# Patient Record
Sex: Female | Born: 1935 | Race: White | Hispanic: No | Smoking: Never smoker
Health system: Southern US, Community
[De-identification: ages and names within clinical notes are randomized; demographics above are authoritative.]

## PROBLEM LIST (undated history)

## (undated) DIAGNOSIS — E785 Hyperlipidemia, unspecified: Secondary | ICD-10-CM

## (undated) DIAGNOSIS — E559 Vitamin D deficiency, unspecified: Secondary | ICD-10-CM

## (undated) DIAGNOSIS — E079 Disorder of thyroid, unspecified: Secondary | ICD-10-CM

## (undated) DIAGNOSIS — I10 Essential (primary) hypertension: Secondary | ICD-10-CM

## (undated) HISTORY — PX: ABDOMINAL HYSTERECTOMY: SHX81

---

## 2004-04-23 ENCOUNTER — Emergency Department (HOSPITAL_COMMUNITY): Admission: EM | Admit: 2004-04-23 | Discharge: 2004-04-23 | Payer: Self-pay | Admitting: Emergency Medicine

## 2005-09-07 ENCOUNTER — Ambulatory Visit (HOSPITAL_BASED_OUTPATIENT_CLINIC_OR_DEPARTMENT_OTHER): Admission: RE | Admit: 2005-09-07 | Discharge: 2005-09-08 | Payer: Self-pay | Admitting: Orthopedic Surgery

## 2008-03-29 ENCOUNTER — Ambulatory Visit: Payer: Self-pay | Admitting: Vascular Surgery

## 2010-05-30 NOTE — Procedures (Signed)
DUPLEX ULTRASOUND OF ABDOMINAL AORTA   INDICATION:  Palpable abdominal mass.   HISTORY:  Diabetes:  No.  Cardiac:  No.  Hypertension:  Yes.  Smoking:  No.  Connective Tissue Disorder:  Family History:  No.  Previous Surgery:  No.   DUPLEX EXAM:         AP (cm)                   TRANSVERSE (cm)  Proximal             2.13 cm                   2.21 cm  Mid                  1.83 cm                   1.76 cm  Distal               1.59 cm                   1.80 cm  Right Iliac          1.25 cm                   1.11 cm  Left Iliac           1.24 cm                   1.20 cm   PREVIOUS:  Date:  AP:  TRANSVERSE:   IMPRESSION:  Duplex shows no evidence of abdominal aortic aneurysm.   ___________________________________________  Janetta Hora Fields, MD   AC/MEDQ  D:  03/29/2008  T:  03/29/2008  Job:  [id number]

## 2010-05-30 NOTE — Procedures (Signed)
CAROTID DUPLEX EXAM   INDICATION:  Carotid bruit.   HISTORY:  Diabetes:  No.  Cardiac:  No.  Hypertension:  Yes.  Smoking:  No.  Previous Surgery:  No.  CV History:  No.  Amaurosis Fugax No, Paresthesias No, Hemiparesis No.                                       RIGHT             LEFT  Brachial systolic pressure:         176               180  Brachial Doppler waveforms:         Biphasic          Biphasic  Vertebral direction of flow:        Antegrade         Antegrade  DUPLEX VELOCITIES (cm/sec)  CCA peak systolic                   72                68  ECA peak systolic                   73                80  ICA peak systolic                   107               70  ICA end diastolic                   25                17  PLAQUE MORPHOLOGY:                  Heterogenous      Heterogenous  PLAQUE AMOUNT:                      Mild              Mild  PLAQUE LOCATION:                    BIF/ICA           BIF/ICA   IMPRESSION:  20-39% bilateral internal carotid artery stenosis.    ___________________________________________  Janetta Hora Fields, MD   AC/MEDQ  D:  03/29/2008  T:  03/29/2008  Job:  [id number]

## 2010-06-02 NOTE — Op Note (Signed)
[name], [name] NO.:  [id number]   MEDICAL RECORD NO.:  [id number]          PATIENT TYPE:  AMB   LOCATION:  DSC                          FACILITY:  [facility]   PHYSICIAN:  Harvie Junior, M.D.   DATE OF BIRTH:  May 13, 1917   DATE OF PROCEDURE:  DATE OF DISCHARGE:                                 OPERATIVE REPORT   SUBJECTIVE:  She is an 77 year old female for orthopedic surgery.  Date of  surgery is going to be 09/07/2005.  Date of dictation is 09/07/2005.   PREOPERATIVE DIAGNOSIS:  Comminuted olecranon fracture left.   POSTOPERATIVE DIAGNOSIS:  Comminuted olecranon fracture left.   PROCEDURE:  Open reduction internal fixation of left comminuted olecranon  fracture.   SURGEON:  Jodi Geralds, M.D.   ANESTHESIA:  General.   BRIEF HISTORY:  Patient is an 77 year old female who was on her way to work  and fell off her bottom stair and suffered an olecranon fracture.  She  presented to an urgent care center and was noted to have this fracture and  sent over for evaluation.  X-rays showed a displaced olecranon fracture and  given her age and osteoporotic status, we felt that this would need to be  fixed.  This is a very active 77 year old who is still working.  She has  been working for the same company for 52 years.  We felt that fixation was  going to be appropriate.  We talked about treatment options and she  ultimately was brought to the operating room for open reduction internal  fixation of her olecranon fracture.   PROCEDURE:  Patient is brought to the operating room, where after adequate  anesthesia was obtained with a general anesthetic, the patient was placed on  the operating table.  The left arm was prepped and draped in the usual  sterile fashion.  Following this, an incision was made over the olecranon  and subcutaneous tissue dissected down to the area of the fracture  fragments.  There is one large fracture fragment of the medial olecranon.  There was a comminuted articular piece laterally and in the over area of the  lateral side.  We took the articular piece and basically anatomically  reduced it and then locked it in with the lateral posterior portion and  basically got an anatomic reduction.  Two long K wires were advanced down  the shaft of the humeral canal and these under fluoro were shown to be dead  center in the canal as well as appropriately placed in the piece.  There was  this other piece over more laterally and we also put it in with a 6.2 K wire  as well to hold it in place and we actually made this a cortical piece to  the other side.  Once this was completed, a tension band of No. 2 fiber wire  was used and the portion was completed.  Fluoro was used prior to this to  make sure that there was anatomic alignment of the fragments and there was  essentially an anatomic alignment of the fracture fragments.  Once the fiber  wire had been locked in place, the  fluoro was again used.  These wires were  then bent and cut and hammered into place.  As always, under fluoro, it  looks as if the wires are not buried, but they in fact are buried and the  medial of the lateral wire we checked actually where it was coming out to  make sure that there was not anything prominent enough to affect a nerve or  anything else on that medial side.  Once this was completed, fluoro was then  used.  Excellent  alignment of all the fracture fragments.  The wound was copiously irrigated  at this point.  It was closed with 2-0 Vicryl and skin staples.  Sterile  compression dressing was applied as well as a posterior splint and the  patient was taken to recovery and noted to be in satisfactory condition.      Harvie Junior, M.D.  Electronically Signed     JLG/MEDQ  D:  09/07/2005  T:  09/08/2005  Job:  [id number]

## 2011-02-06 ENCOUNTER — Ambulatory Visit (INDEPENDENT_AMBULATORY_CARE_PROVIDER_SITE_OTHER): Payer: Medicare Other | Admitting: Emergency Medicine

## 2011-02-06 DIAGNOSIS — I10 Essential (primary) hypertension: Secondary | ICD-10-CM

## 2011-02-06 DIAGNOSIS — E782 Mixed hyperlipidemia: Secondary | ICD-10-CM

## 2011-02-06 DIAGNOSIS — M7020 Olecranon bursitis, unspecified elbow: Secondary | ICD-10-CM

## 2011-02-06 DIAGNOSIS — Z79899 Other long term (current) drug therapy: Secondary | ICD-10-CM

## 2011-02-07 ENCOUNTER — Ambulatory Visit (INDEPENDENT_AMBULATORY_CARE_PROVIDER_SITE_OTHER): Payer: Medicare Other

## 2011-02-07 DIAGNOSIS — M7020 Olecranon bursitis, unspecified elbow: Secondary | ICD-10-CM

## 2011-02-07 DIAGNOSIS — M25429 Effusion, unspecified elbow: Secondary | ICD-10-CM

## 2011-02-12 ENCOUNTER — Ambulatory Visit (INDEPENDENT_AMBULATORY_CARE_PROVIDER_SITE_OTHER): Payer: Medicare Other

## 2011-02-12 DIAGNOSIS — M6789 Other specified disorders of synovium and tendon, multiple sites: Secondary | ICD-10-CM

## 2011-02-12 DIAGNOSIS — M25529 Pain in unspecified elbow: Secondary | ICD-10-CM

## 2011-02-21 ENCOUNTER — Ambulatory Visit (INDEPENDENT_AMBULATORY_CARE_PROVIDER_SITE_OTHER): Payer: Medicare Other | Admitting: Emergency Medicine

## 2011-02-21 VITALS — BP 149/80 | HR 76 | Temp 97.8°F | Resp 18 | Ht 67.5 in | Wt 115.0 lb

## 2011-02-21 DIAGNOSIS — M7020 Olecranon bursitis, unspecified elbow: Secondary | ICD-10-CM

## 2011-02-21 DIAGNOSIS — M7021 Olecranon bursitis, right elbow: Secondary | ICD-10-CM

## 2011-02-21 DIAGNOSIS — M7989 Other specified soft tissue disorders: Secondary | ICD-10-CM

## 2011-02-21 NOTE — Progress Notes (Signed)
  Subjective:    Patient ID: Bonnie [name], female    DOB: March 27, 1917, 78 y.o.   MRN: [id number]  HPI patient enters due to recurrence of swelling in her right elbow.    Review of Systems this and has had recurrent swelling in the right elbow and has had aspiration of her elbow x3 in the last 10 days. At the last visit she had injection of 20 mg of Kenalog after aspiration of the olecranon bursa     Objective:   Physical Exam Objective exam reveals enlargement of the right olecranon bursa. After prepping with Betadine the area was nontender with 1 cc of 2% plain. A 20-gauge needle was used and 12 cc of serosanguineous fluid was aspirated without difficulty.       Assessment & Plan:  Assessment as olecranon bursitis with recurrent fluid accumulation. Patient wants to avoid surgery. We'll continue to try and aspirate and keep compression on the area and hopefully the fluid will not reaccumulate.

## 2011-04-25 ENCOUNTER — Other Ambulatory Visit: Payer: Self-pay | Admitting: Family Medicine

## 2011-04-25 MED ORDER — LOSARTAN POTASSIUM-HCTZ 100-25 MG PO TABS: 1.0000 | ORAL_TABLET | Freq: Every day | ORAL | Status: DC

## 2011-04-25 MED ORDER — PRAVASTATIN SODIUM 40 MG PO TABS: 40.0000 mg | ORAL_TABLET | Freq: Every day | ORAL | Status: DC

## 2011-04-25 MED ORDER — LOSARTAN POTASSIUM-HCTZ 100-12.5 MG PO TABS: 1.0000 | ORAL_TABLET | Freq: Every day | ORAL | Status: DC

## 2011-04-25 NOTE — Progress Notes (Signed)
Addended by: Morrell Riddle on: 04/25/2011 12:54 PM   Modules accepted: Orders

## 2011-05-08 ENCOUNTER — Other Ambulatory Visit: Payer: Self-pay

## 2011-05-08 NOTE — Telephone Encounter (Signed)
Spoke with pharmacist she wanted rx refills for this pt on Metoprolol. I authorized 2 refills

## 2011-05-15 ENCOUNTER — Ambulatory Visit (INDEPENDENT_AMBULATORY_CARE_PROVIDER_SITE_OTHER): Payer: Medicare Other | Admitting: Emergency Medicine

## 2011-05-15 VITALS — BP 152/65 | HR 60 | Temp 97.4°F | Resp 18 | Ht 67.5 in | Wt 117.6 lb

## 2011-05-15 DIAGNOSIS — I10 Essential (primary) hypertension: Secondary | ICD-10-CM

## 2011-05-15 NOTE — Progress Notes (Signed)
  Subjective:    Patient ID: Bonnie [name], female    DOB: 09-Feb-1917, 78 y.o.   MRN: [id number]  HPI Anab is in for followup of her high blood pressure. She has no chest pain shortness of breath or problems. Labs done last, normal    Review of Systems     Objective:   Physical Exam Blood pressure repeated was 150/70 chest clear heart regular rate no murmur       Assessment & Plan:  Assessment is hypertension under good control no change in treatment

## 2011-05-21 ENCOUNTER — Other Ambulatory Visit: Payer: Self-pay | Admitting: Physician Assistant

## 2011-06-05 ENCOUNTER — Ambulatory Visit: Payer: Medicare Other | Admitting: Emergency Medicine

## 2011-06-20 ENCOUNTER — Other Ambulatory Visit: Payer: Self-pay | Admitting: Physician Assistant

## 2011-06-30 ENCOUNTER — Other Ambulatory Visit: Payer: Self-pay | Admitting: Emergency Medicine

## 2011-07-30 ENCOUNTER — Other Ambulatory Visit: Payer: Self-pay | Admitting: Physician Assistant

## 2011-08-13 ENCOUNTER — Other Ambulatory Visit: Payer: Self-pay | Admitting: Physician Assistant

## 2011-08-14 ENCOUNTER — Other Ambulatory Visit: Payer: Self-pay | Admitting: Physician Assistant

## 2011-08-28 ENCOUNTER — Ambulatory Visit (INDEPENDENT_AMBULATORY_CARE_PROVIDER_SITE_OTHER): Payer: Medicare Other | Admitting: Emergency Medicine

## 2011-08-28 VITALS — BP 138/62 | HR 60 | Temp 97.5°F | Resp 16 | Ht 67.5 in | Wt 117.0 lb

## 2011-08-28 DIAGNOSIS — I10 Essential (primary) hypertension: Secondary | ICD-10-CM

## 2011-08-28 DIAGNOSIS — E785 Hyperlipidemia, unspecified: Secondary | ICD-10-CM

## 2011-08-28 LAB — COMPREHENSIVE METABOLIC PANEL
AST: 18 U/L (ref 0–37)
Albumin: 4 g/dL (ref 3.5–5.2)
BUN: 33 mg/dL — ABNORMAL HIGH (ref 6–23)
Calcium: 10.4 mg/dL (ref 8.4–10.5)
Chloride: 100 mEq/L (ref 96–112)
Potassium: 4.3 mEq/L (ref 3.5–5.3)

## 2011-08-28 LAB — CBC WITH DIFFERENTIAL/PLATELET
Basophils Absolute: 0 10*3/uL (ref 0.0–0.1)
Basophils Relative: 0 % (ref 0–1)
Eosinophils Relative: 2 % (ref 0–5)
HCT: 38.3 % (ref 36.0–46.0)
Lymphocytes Relative: 37 % (ref 12–46)
Lymphs Abs: 2.6 10*3/uL (ref 0.7–4.0)
MCV: 89.7 fL (ref 78.0–100.0)
WBC: 7 10*3/uL (ref 4.0–10.5)

## 2011-08-28 LAB — LIPID PANEL
Cholesterol: 173 mg/dL (ref 0–200)
Total CHOL/HDL Ratio: 3.4 Ratio
VLDL: 20 mg/dL (ref 0–40)

## 2011-08-28 NOTE — Progress Notes (Signed)
  Subjective:    Patient ID: Bonnie [name], female    DOB: 1917-08-18, 78 y.o.   MRN: [id number]  HPI patient here to followup on her blood pressure. Patient denies chest pain shortness of breath or any complaints at the present time. She is eating well without problems. She is currently also undergoing treatment for hyperlipidemia.   Review of Systems     Objective:   Physical Exam HEENT exam is normal. Chest is clear. Heart regular rate no murmurs        Assessment & Plan:  Patient's blood pressure is at goal. We'll check labs including cholesterol.

## 2011-09-01 ENCOUNTER — Other Ambulatory Visit: Payer: Self-pay | Admitting: Physician Assistant

## 2011-09-15 ENCOUNTER — Other Ambulatory Visit: Payer: Self-pay | Admitting: Physician Assistant

## 2011-09-26 ENCOUNTER — Other Ambulatory Visit: Payer: Self-pay | Admitting: Physician Assistant

## 2011-11-03 ENCOUNTER — Other Ambulatory Visit: Payer: Self-pay | Admitting: Physician Assistant

## 2011-11-04 ENCOUNTER — Other Ambulatory Visit: Payer: Self-pay | Admitting: Internal Medicine

## 2011-12-01 ENCOUNTER — Other Ambulatory Visit: Payer: Self-pay | Admitting: Physician Assistant

## 2012-01-01 ENCOUNTER — Encounter: Payer: Self-pay | Admitting: Emergency Medicine

## 2012-01-01 ENCOUNTER — Ambulatory Visit (INDEPENDENT_AMBULATORY_CARE_PROVIDER_SITE_OTHER): Payer: Medicare Other | Admitting: Emergency Medicine

## 2012-01-01 VITALS — BP 142/68 | HR 70 | Temp 98.3°F | Resp 16 | Ht 67.0 in | Wt 125.0 lb

## 2012-01-01 DIAGNOSIS — M949 Disorder of cartilage, unspecified: Secondary | ICD-10-CM

## 2012-01-01 DIAGNOSIS — E785 Hyperlipidemia, unspecified: Secondary | ICD-10-CM

## 2012-01-01 DIAGNOSIS — M8580 Other specified disorders of bone density and structure, unspecified site: Secondary | ICD-10-CM

## 2012-01-01 DIAGNOSIS — I10 Essential (primary) hypertension: Secondary | ICD-10-CM

## 2012-01-01 DIAGNOSIS — M810 Age-related osteoporosis without current pathological fracture: Secondary | ICD-10-CM | POA: Insufficient documentation

## 2012-01-01 LAB — COMPREHENSIVE METABOLIC PANEL
Alkaline Phosphatase: 46 U/L (ref 39–117)
BUN: 22 mg/dL (ref 6–23)
Chloride: 100 mEq/L (ref 96–112)
Glucose, Bld: 91 mg/dL (ref 70–99)
Potassium: 4.2 mEq/L (ref 3.5–5.3)
Total Bilirubin: 0.5 mg/dL (ref 0.3–1.2)

## 2012-01-01 LAB — LIPID PANEL
HDL: 58 mg/dL (ref >39)
LDL Cholesterol: 82 mg/dL (ref 0–99)
VLDL: 19 mg/dL (ref 0–40)

## 2012-01-01 NOTE — Progress Notes (Signed)
  Subjective:    Patient ID: Bonnie [name], female    DOB: October 15, 1917, 78 y.o.   MRN: [id number]  HPI problem #1 hypertension. She takes her medication Toprol and will start HCTZ and exercises on a regular basis. #2 high cholesterol. She continues to watch her diet and take her pravastatin. 5 lumbar 3 osteoporosis. She does exercise regularly and is currently taking her vitamin D    Review of Systems     Objective:   Physical Exam she looks great she is alert and cooperative. Her neck is supple. There are no carotid bruits heard. Chest is clear to auscultation and percussion. Cardiac exam is regular rate without murmurs. The abdomen is soft and nontender        Assessment & Plan:  Labs were done today to monitor her renal function. Her BUN was elevated at 30 last check. No change in medications at present time.

## 2012-01-02 LAB — VITAMIN D 25 HYDROXY (VIT D DEFICIENCY, FRACTURES): Vit D, 25-Hydroxy: 47 ng/mL (ref 30–89)

## 2012-02-23 ENCOUNTER — Other Ambulatory Visit: Payer: Self-pay | Admitting: Physician Assistant

## 2012-03-08 ENCOUNTER — Other Ambulatory Visit: Payer: Self-pay | Admitting: Physician Assistant

## 2012-05-18 ENCOUNTER — Other Ambulatory Visit: Payer: Self-pay | Admitting: Physician Assistant

## 2012-06-07 ENCOUNTER — Other Ambulatory Visit: Payer: Self-pay | Admitting: Physician Assistant

## 2012-06-17 ENCOUNTER — Other Ambulatory Visit: Payer: Self-pay | Admitting: Physician Assistant

## 2012-06-25 ENCOUNTER — Other Ambulatory Visit: Payer: Self-pay | Admitting: Physician Assistant

## 2012-06-26 ENCOUNTER — Other Ambulatory Visit: Payer: Self-pay

## 2012-07-01 ENCOUNTER — Encounter: Payer: Self-pay | Admitting: Emergency Medicine

## 2012-07-01 ENCOUNTER — Ambulatory Visit (INDEPENDENT_AMBULATORY_CARE_PROVIDER_SITE_OTHER): Payer: Medicare Other | Admitting: Emergency Medicine

## 2012-07-01 VITALS — BP 134/70 | HR 79 | Temp 98.9°F | Resp 16 | Ht 67.0 in | Wt 124.2 lb

## 2012-07-01 DIAGNOSIS — M810 Age-related osteoporosis without current pathological fracture: Secondary | ICD-10-CM

## 2012-07-01 DIAGNOSIS — E785 Hyperlipidemia, unspecified: Secondary | ICD-10-CM

## 2012-07-01 DIAGNOSIS — I10 Essential (primary) hypertension: Secondary | ICD-10-CM

## 2012-07-01 LAB — COMPREHENSIVE METABOLIC PANEL
ALT: 11 U/L (ref 0–35)
AST: 17 U/L (ref 0–37)
Albumin: 4 g/dL (ref 3.5–5.2)
Alkaline Phosphatase: 49 U/L (ref 39–117)
BUN: 16 mg/dL (ref 6–23)
Creat: 0.78 mg/dL (ref 0.50–1.10)
Glucose, Bld: 107 mg/dL — ABNORMAL HIGH (ref 70–99)
Total Bilirubin: 0.6 mg/dL (ref 0.3–1.2)

## 2012-07-01 LAB — CBC WITH DIFFERENTIAL/PLATELET
Basophils Relative: 0 % (ref 0–1)
Eosinophils Absolute: 0.1 10*3/uL (ref 0.0–0.7)
Lymphs Abs: 1.8 10*3/uL (ref 0.7–4.0)
MCH: 29.7 pg (ref 26.0–34.0)
MCV: 87.7 fL (ref 78.0–100.0)
Platelets: 155 10*3/uL (ref 150–400)
RDW: 14.2 % (ref 11.5–15.5)

## 2012-07-01 LAB — LIPID PANEL
Cholesterol: 177 mg/dL (ref 0–200)
Triglycerides: 105 mg/dL (ref <150)

## 2012-07-01 NOTE — Progress Notes (Signed)
  Subjective:    Patient ID: Bonnie [name], female    DOB: February 06, 1917, 79 y.o.   MRN: [id number]  HPI 79 year old enters for followup of high blood pressure and high cholesterol. She's feeling great. She stays active during the day. She has no complaints today. She denies any chest pain or swelling.    Review of Systems     Objective:   Physical Exam patient is alert and cooperative in no distress. Her neck is supple. Her chest is clear to both auscultation and percussion. Her extremities are without edema.        Assessment & Plan:  Patient doing well no changes in medication labs done today

## 2012-07-14 ENCOUNTER — Other Ambulatory Visit: Payer: Self-pay | Admitting: Emergency Medicine

## 2012-07-26 ENCOUNTER — Other Ambulatory Visit: Payer: Self-pay | Admitting: Physician Assistant

## 2012-08-09 ENCOUNTER — Other Ambulatory Visit: Payer: Self-pay | Admitting: Physician Assistant

## 2012-09-13 ENCOUNTER — Other Ambulatory Visit: Payer: Self-pay | Admitting: Physician Assistant

## 2012-11-04 ENCOUNTER — Ambulatory Visit (INDEPENDENT_AMBULATORY_CARE_PROVIDER_SITE_OTHER): Payer: Medicare Other | Admitting: Emergency Medicine

## 2012-11-04 VITALS — BP 162/70 | HR 60 | Temp 98.4°F | Resp 16 | Ht 67.0 in | Wt 129.0 lb

## 2012-11-04 DIAGNOSIS — I493 Ventricular premature depolarization: Secondary | ICD-10-CM | POA: Insufficient documentation

## 2012-11-04 DIAGNOSIS — I4949 Other premature depolarization: Secondary | ICD-10-CM

## 2012-11-04 NOTE — Progress Notes (Signed)
  Subjective:    Patient ID: Bonnie [name], female    DOB: 09-01-1917, 79 y.o.   MRN: [id number]  HPI patient here for followup. She has high blood pressure and high cholesterol. Just has PVCs. She feels well. She has not had any chest pain shortness of breath or any symptoms. She declined shingles vaccine she declines flu shot she declines colon check.    Review of Systems     Objective:   Physical Exam HEENT exam is unremarkable. Neck is supple. Chest is clear to auscultation and percussion. Cardiac exam reveals occasional skipped beats. Repeat blood pressure was 120/70        Assessment & Plan:  Patient looks great today. I did not do any blood work today. When she returns in 3 months she will need a CBC  Cmet and lipid panel.

## 2012-12-13 ENCOUNTER — Other Ambulatory Visit: Payer: Self-pay | Admitting: Physician Assistant

## 2013-01-20 ENCOUNTER — Other Ambulatory Visit: Payer: Self-pay | Admitting: Physician Assistant

## 2013-01-27 ENCOUNTER — Ambulatory Visit: Payer: Medicare Other | Admitting: Emergency Medicine

## 2013-01-30 ENCOUNTER — Other Ambulatory Visit: Payer: Self-pay | Admitting: Physician Assistant

## 2013-02-10 ENCOUNTER — Ambulatory Visit: Payer: Medicare Other | Admitting: Emergency Medicine

## 2013-02-21 ENCOUNTER — Other Ambulatory Visit: Payer: Self-pay | Admitting: Physician Assistant

## 2013-03-16 ENCOUNTER — Other Ambulatory Visit: Payer: Self-pay | Admitting: Physician Assistant

## 2013-04-13 ENCOUNTER — Other Ambulatory Visit: Payer: Self-pay | Admitting: Physician Assistant

## 2013-04-25 ENCOUNTER — Other Ambulatory Visit: Payer: Self-pay | Admitting: Physician Assistant

## 2013-04-27 ENCOUNTER — Other Ambulatory Visit: Payer: Self-pay | Admitting: Emergency Medicine

## 2013-04-28 ENCOUNTER — Other Ambulatory Visit: Payer: Self-pay | Admitting: Emergency Medicine

## 2013-04-29 ENCOUNTER — Ambulatory Visit (INDEPENDENT_AMBULATORY_CARE_PROVIDER_SITE_OTHER): Payer: Medicare Other | Admitting: Emergency Medicine

## 2013-04-29 VITALS — BP 154/60 | HR 62 | Temp 97.7°F | Resp 16 | Ht 67.0 in | Wt 129.6 lb

## 2013-04-29 DIAGNOSIS — E785 Hyperlipidemia, unspecified: Secondary | ICD-10-CM

## 2013-04-29 DIAGNOSIS — I10 Essential (primary) hypertension: Secondary | ICD-10-CM

## 2013-04-29 DIAGNOSIS — Z79899 Other long term (current) drug therapy: Secondary | ICD-10-CM

## 2013-04-29 LAB — COMPREHENSIVE METABOLIC PANEL
ALBUMIN: 4 g/dL (ref 3.5–5.2)
ALT: 14 U/L (ref 0–35)
AST: 18 U/L (ref 0–37)
Alkaline Phosphatase: 56 U/L (ref 39–117)
BUN: 19 mg/dL (ref 6–23)
CO2: 31 mEq/L (ref 19–32)
CREATININE: 0.71 mg/dL (ref 0.50–1.10)
Calcium: 10.1 mg/dL (ref 8.4–10.5)
Chloride: 98 mEq/L (ref 96–112)
GLUCOSE: 126 mg/dL — AB (ref 70–99)
Potassium: 4.8 mEq/L (ref 3.5–5.3)
Sodium: 136 mEq/L (ref 135–145)
Total Bilirubin: 0.5 mg/dL (ref 0.2–1.2)
Total Protein: 6.5 g/dL (ref 6.0–8.3)

## 2013-04-29 LAB — POCT CBC
Granulocyte percent: 60.2 %G (ref 37–80)
HEMATOCRIT: 41.9 % (ref 37.7–47.9)
Hemoglobin: 13.1 g/dL (ref 12.2–16.2)
LYMPH, POC: 1.6 (ref 0.6–3.4)
MCH, POC: 29.6 pg (ref 27–31.2)
MCHC: 31.3 g/dL — AB (ref 31.8–35.4)
MCV: 94.9 fL (ref 80–97)
MID (CBC): 0.4 (ref 0–0.9)
MPV: 10.6 fL (ref 0–99.8)
POC Granulocyte: 3 (ref 2–6.9)
POC LYMPH PERCENT: 31.5 %L (ref 10–50)
POC MID %: 8.3 %M (ref 0–12)
Platelet Count, POC: 149 10*3/uL (ref 142–424)
RBC: 4.42 M/uL (ref 4.04–5.48)
RDW, POC: 14.5 %
WBC: 5 10*3/uL (ref 4.6–10.2)

## 2013-04-29 LAB — LIPID PANEL
CHOL/HDL RATIO: 3.2 ratio
Cholesterol: 168 mg/dL (ref 0–200)
HDL: 53 mg/dL (ref >39)
LDL CALC: 97 mg/dL (ref 0–99)
TRIGLYCERIDES: 92 mg/dL (ref <150)
VLDL: 18 mg/dL (ref 0–40)

## 2013-04-29 MED ORDER — PRAVASTATIN SODIUM 40 MG PO TABS: ORAL_TABLET | ORAL | Status: DC

## 2013-04-29 MED ORDER — LOSARTAN POTASSIUM-HCTZ 100-25 MG PO TABS: ORAL_TABLET | ORAL | Status: DC

## 2013-04-29 NOTE — Progress Notes (Signed)
   Subjective:   Patient ID: Bonnie Harvestorothy J Flannery, female    DOB: 05/14/1917, 80 y.o.   MRN: [id number] This chart was scribed for Lesle ChrisSteven Tin Engram, MD by Marica OtterNusrat Rahman, ED Scribe. This patient was seen in room 9 and the patient's care was started at 10:55 AM.    PCP: Lucilla EdinAUB, STEVE A, MD  HPI HPI Comments: Bonnie HarvestDorothy J [name] is a 80 y.o. female, with a history of high blood pressure and high cholesterol, who presents to the [facility] for a follow-up and medicine refill. Pt reports she has been feeling well, no change in appetite and no new problems. Pt reports she exercises regularly. Specifically, pt reports she walks everyday and goes bowling once a week.   Review of Systems  Constitutional: Negative for appetite change.  All other systems reviewed and are negative.  Objective:   Physical Exam patient is alert and cooperative. Her neck is supple. There are no carotid bruits. Her chest was clear to auscultation and percussion. Heart is regular rate without murmurs. Abdomen soft nontender extremities are without edema.  DIAGNOSTIC STUDIES: Oxygen Saturation is 98% on RA, normal by my interpretation.    COORDINATION OF CARE: 10:57 AM-Discussed treatment plan which includes meds refill and labs with pt at bedside and pt agreed to plan.   Assessment & Plan:  Lipid panel was done today. Routine labs were done today. Her prescription for pravastatin was called in for one year.

## 2013-05-15 ENCOUNTER — Other Ambulatory Visit: Payer: Self-pay | Admitting: Emergency Medicine

## 2013-05-16 ENCOUNTER — Other Ambulatory Visit: Payer: Self-pay | Admitting: Emergency Medicine

## 2013-07-26 ENCOUNTER — Encounter (HOSPITAL_COMMUNITY): Payer: Self-pay | Admitting: Emergency Medicine

## 2013-07-26 ENCOUNTER — Emergency Department (HOSPITAL_COMMUNITY): Payer: Medicare Other

## 2013-07-26 ENCOUNTER — Inpatient Hospital Stay (HOSPITAL_COMMUNITY)
Admission: EM | Admit: 2013-07-26 | Discharge: 2013-07-30 | DRG: 312 | Disposition: A | Discharge status: Home / Self Care | Payer: Medicare Other | Attending: Cardiovascular Disease | Admitting: Cardiovascular Disease

## 2013-07-26 DIAGNOSIS — I10 Essential (primary) hypertension: Secondary | ICD-10-CM | POA: Diagnosis present

## 2013-07-26 DIAGNOSIS — I4891 Unspecified atrial fibrillation: Secondary | ICD-10-CM | POA: Diagnosis not present

## 2013-07-26 DIAGNOSIS — M542 Cervicalgia: Secondary | ICD-10-CM | POA: Diagnosis present

## 2013-07-26 DIAGNOSIS — R55 Syncope and collapse: Principal | ICD-10-CM | POA: Diagnosis present

## 2013-07-26 DIAGNOSIS — E785 Hyperlipidemia, unspecified: Secondary | ICD-10-CM | POA: Diagnosis present

## 2013-07-26 DIAGNOSIS — I080 Rheumatic disorders of both mitral and aortic valves: Secondary | ICD-10-CM | POA: Diagnosis present

## 2013-07-26 DIAGNOSIS — N390 Urinary tract infection, site not specified: Secondary | ICD-10-CM | POA: Diagnosis present

## 2013-07-26 DIAGNOSIS — E876 Hypokalemia: Secondary | ICD-10-CM | POA: Diagnosis present

## 2013-07-26 DIAGNOSIS — R001 Bradycardia, unspecified: Secondary | ICD-10-CM

## 2013-07-26 DIAGNOSIS — I498 Other specified cardiac arrhythmias: Secondary | ICD-10-CM | POA: Diagnosis present

## 2013-07-26 DIAGNOSIS — I2789 Other specified pulmonary heart diseases: Secondary | ICD-10-CM | POA: Diagnosis present

## 2013-07-26 DIAGNOSIS — I959 Hypotension, unspecified: Secondary | ICD-10-CM | POA: Diagnosis present

## 2013-07-26 LAB — CBC
HCT: 39.4 % (ref 36.0–46.0)
HCT: 35.8 % — ABNORMAL LOW (ref 36.0–46.0)
Hemoglobin: 13.5 g/dL (ref 12.0–15.0)
Hemoglobin: 12.1 g/dL (ref 12.0–15.0)
MCH: 30.7 pg (ref 26.0–34.0)
MCH: 30.3 pg (ref 26.0–34.0)
MCHC: 34.3 g/dL (ref 30.0–36.0)
MCHC: 33.8 g/dL (ref 30.0–36.0)
MCV: 89.5 fL (ref 78.0–100.0)
MCV: 89.7 fL (ref 78.0–100.0)
PLATELETS: 139 10*3/uL — AB (ref 150–400)
PLATELETS: 140 10*3/uL — AB (ref 150–400)
RBC: 4.4 MIL/uL (ref 3.87–5.11)
RBC: 3.99 MIL/uL (ref 3.87–5.11)
RDW: 13.4 % (ref 11.5–15.5)
RDW: 13.2 % (ref 11.5–15.5)
WBC: 9.2 10*3/uL (ref 4.0–10.5)
WBC: 9 10*3/uL (ref 4.0–10.5)

## 2013-07-26 LAB — BASIC METABOLIC PANEL
Anion gap: 16 — ABNORMAL HIGH (ref 5–15)
BUN: 18 mg/dL (ref 6–23)
CO2: 24 mEq/L (ref 19–32)
CREATININE: 0.75 mg/dL (ref 0.50–1.10)
Calcium: 10.5 mg/dL (ref 8.4–10.5)
Chloride: 95 mEq/L — ABNORMAL LOW (ref 96–112)
GFR, EST AFRICAN AMERICAN: 81 mL/min — AB (ref >90)
GFR, EST NON AFRICAN AMERICAN: 70 mL/min — AB (ref >90)
Glucose, Bld: 138 mg/dL — ABNORMAL HIGH (ref 70–99)
Potassium: 3.6 mEq/L — ABNORMAL LOW (ref 3.7–5.3)
Sodium: 135 mEq/L — ABNORMAL LOW (ref 137–147)

## 2013-07-26 LAB — URINALYSIS, ROUTINE W REFLEX MICROSCOPIC
BILIRUBIN URINE: NEGATIVE
Glucose, UA: NEGATIVE mg/dL
HGB URINE DIPSTICK: NEGATIVE
Ketones, ur: NEGATIVE mg/dL
Nitrite: NEGATIVE
PROTEIN: NEGATIVE mg/dL
Specific Gravity, Urine: 1.011 (ref 1.005–1.030)
UROBILINOGEN UA: 1 mg/dL (ref 0.0–1.0)
pH: 8 (ref 5.0–8.0)

## 2013-07-26 LAB — I-STAT TROPONIN, ED: Troponin i, poc: 0 ng/mL (ref 0.00–0.08)

## 2013-07-26 LAB — I-STAT CG4 LACTIC ACID, ED: Lactic Acid, Venous: 1.87 mmol/L (ref 0.5–2.2)

## 2013-07-26 LAB — TSH: TSH: 7.69 u[IU]/mL — ABNORMAL HIGH (ref 0.350–4.500)

## 2013-07-26 LAB — MRSA PCR SCREENING: MRSA by PCR: NEGATIVE

## 2013-07-26 LAB — CREATININE, SERUM
CREATININE: 0.74 mg/dL (ref 0.50–1.10)
GFR calc Af Amer: 82 mL/min — ABNORMAL LOW (ref >90)
GFR calc non Af Amer: 71 mL/min — ABNORMAL LOW (ref >90)

## 2013-07-26 LAB — URINE MICROSCOPIC-ADD ON

## 2013-07-26 MED ORDER — ATROPINE SULFATE 0.4 MG/ML IJ SOLN
0.4000 mg | Freq: Once | INTRAMUSCULAR | Status: DC
  Filled 2013-07-26: qty 1

## 2013-07-26 MED ORDER — ASPIRIN EC 81 MG PO TBEC
81.0000 mg | DELAYED_RELEASE_TABLET | Freq: Every day | ORAL | Status: DC
  Administered 2013-07-26 – 2013-07-29 (×4): 81 mg via ORAL
  Filled 2013-07-26 (×5): qty 1

## 2013-07-26 MED ORDER — ACETAMINOPHEN 650 MG RE SUPP: 650.0000 mg | Freq: Four times a day (QID) | RECTAL | Status: DC | PRN

## 2013-07-26 MED ORDER — POTASSIUM CHLORIDE CRYS ER 10 MEQ PO TBCR
10.0000 meq | EXTENDED_RELEASE_TABLET | Freq: Two times a day (BID) | ORAL | Status: AC
  Administered 2013-07-26 – 2013-07-27 (×4): 10 meq via ORAL
  Filled 2013-07-26: qty 0.5
  Filled 2013-07-26 (×3): qty 1

## 2013-07-26 MED ORDER — ATROPINE SULFATE 0.1 MG/ML IJ SOLN
INTRAMUSCULAR | Status: AC
  Filled 2013-07-26: qty 10

## 2013-07-26 MED ORDER — SODIUM CHLORIDE 0.9 % IV SOLN
INTRAVENOUS | Status: DC
  Administered 2013-07-26 – 2013-07-27 (×2): via INTRAVENOUS

## 2013-07-26 MED ORDER — DOCUSATE SODIUM 100 MG PO CAPS
100.0000 mg | ORAL_CAPSULE | Freq: Two times a day (BID) | ORAL | Status: DC
  Administered 2013-07-26 – 2013-07-29 (×7): 100 mg via ORAL
  Filled 2013-07-26 (×9): qty 1

## 2013-07-26 MED ORDER — LEVOTHYROXINE SODIUM 25 MCG PO TABS
25.0000 ug | ORAL_TABLET | Freq: Every day | ORAL | Status: DC
  Administered 2013-07-27 – 2013-07-29 (×3): 25 ug via ORAL
  Filled 2013-07-26 (×4): qty 1

## 2013-07-26 MED ORDER — LATANOPROST 0.005 % OP SOLN
1.0000 [drp] | Freq: Every day | OPHTHALMIC | Status: DC
  Administered 2013-07-26 – 2013-07-29 (×4): 1 [drp] via OPHTHALMIC
  Filled 2013-07-26 (×3): qty 2.5

## 2013-07-26 MED ORDER — ONDANSETRON HCL 4 MG/2ML IJ SOLN: 4.0000 mg | Freq: Four times a day (QID) | INTRAMUSCULAR | Status: DC | PRN

## 2013-07-26 MED ORDER — HEPARIN SODIUM (PORCINE) 5000 UNIT/ML IJ SOLN
5000.0000 [IU] | Freq: Three times a day (TID) | INTRAMUSCULAR | Status: DC
  Administered 2013-07-26 – 2013-07-30 (×11): 5000 [IU] via SUBCUTANEOUS
  Filled 2013-07-26 (×14): qty 1

## 2013-07-26 MED ORDER — SODIUM CHLORIDE 0.9 % IV BOLUS (SEPSIS)
500.0000 mL | Freq: Once | INTRAVENOUS | Status: AC
  Administered 2013-07-26: 500 mL via INTRAVENOUS

## 2013-07-26 MED ORDER — ONDANSETRON HCL 4 MG PO TABS: 4.0000 mg | ORAL_TABLET | Freq: Four times a day (QID) | ORAL | Status: DC | PRN

## 2013-07-26 MED ORDER — ACETAMINOPHEN 325 MG PO TABS
650.0000 mg | ORAL_TABLET | Freq: Four times a day (QID) | ORAL | Status: DC | PRN
  Administered 2013-07-27 – 2013-07-29 (×2): 650 mg via ORAL
  Filled 2013-07-26 (×2): qty 2

## 2013-07-26 MED ORDER — SIMVASTATIN 10 MG PO TABS
10.0000 mg | ORAL_TABLET | Freq: Every day | ORAL | Status: DC
  Administered 2013-07-26 – 2013-07-29 (×4): 10 mg via ORAL
  Filled 2013-07-26 (×6): qty 1

## 2013-07-26 MED ORDER — ONDANSETRON HCL 4 MG/2ML IJ SOLN
4.0000 mg | Freq: Once | INTRAMUSCULAR | Status: AC
  Administered 2013-07-26: 4 mg via INTRAVENOUS
  Filled 2013-07-26: qty 2

## 2013-07-26 NOTE — ED Notes (Signed)
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 LOC and did hit posterior neck with c/o pain. Moving all extremities equally. A&Ox4. Per patient "I just don't feel good." Has nausea but denies V, D, F, chills, abdominal pain, or blood in stools. MD Gwendolyn GrantWalden at bedside.

## 2013-07-26 NOTE — ED Notes (Signed)
Pt HR continues to drop to 20-30 bpm. MD aware. Atropine 1mg  at bedside. Ordered to hold until cardiology consult. Pt still alert and arousable, moving all extremities.

## 2013-07-26 NOTE — H&P (Signed)
Referring Physician: Arlyss Queen, MD  Bonnie [name] is an 80 y.o. female.                       Chief Complaint: Loss of consciousness and neck pain  HPI: 80 y.o. 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 She had nausea, no speech problem.   History reviewed. No pertinent past medical history.  No diabetes or smoking or alcohol intake.   Past Surgical History  Procedure Laterality Date  . Abdominal hysterectomy      No family history on file. Social History:  reports that she has never smoked. She does not have any smokeless tobacco history on file. She reports that she does not drink alcohol. Her drug history is not on file.  Allergies:  Allergies  Allergen Reactions  . Norvasc [Amlodipine Besylate]      (Not in a hospital admission)  Results for orders placed during the hospital encounter of 07/26/13 (from the past 48 hour(s))  CBC     Status: Abnormal   Collection Time    07/26/13 11:24 AM      Result Value Ref Range   WBC 9.2  4.0 - 10.5 K/uL   RBC 4.40  3.87 - 5.11 MIL/uL   Hemoglobin 13.5  12.0 - 15.0 g/dL   HCT 39.4  36.0 - 46.0 %   MCV 89.5  78.0 - 100.0 fL   MCH 30.7  26.0 - 34.0 pg   MCHC 34.3  30.0 - 36.0 g/dL   RDW 13.4  11.5 - 15.5 %   Platelets 139 (*) 150 - 400 K/uL  BASIC METABOLIC PANEL     Status: Abnormal   Collection Time    07/26/13 11:24 AM      Result Value Ref Range   Sodium 135 (*) 137 - 147 mEq/L   Potassium 3.6 (*) 3.7 - 5.3 mEq/L   Chloride 95 (*) 96 - 112 mEq/L   CO2 24  19 - 32 mEq/L   Glucose, Bld 138 (*) 70 - 99 mg/dL   BUN 18  6 - 23 mg/dL   Creatinine, Ser 0.75  0.50 - 1.10 mg/dL   Calcium 10.5  8.4 - 10.5 mg/dL   GFR calc non Af Amer 70 (*) >90 mL/min   GFR calc Af Amer 81 (*) >90 mL/min   Comment: (NOTE)     The eGFR has been calculated using the CKD EPI equation.     This calculation has not been validated in all clinical situations.     eGFR's persistently <90 mL/min signify  possible Chronic Kidney     Disease.   Anion gap 16 (*) 5 - 15  I-STAT TROPOININ, ED     Status: None   Collection Time    07/26/13 11:30 AM      Result Value Ref Range   Troponin i, poc 0.00  0.00 - 0.08 ng/mL   Comment 3            Comment: Due to the release kinetics of cTnI,     a negative result within the first hours     of the onset of symptoms does not rule out     myocardial infarction with certainty.     If myocardial infarction is still suspected,     repeat the test at appropriate intervals.  I-STAT CG4 LACTIC ACID, ED     Status: None   Collection Time  07/26/13 11:32 AM      Result Value Ref Range   Lactic Acid, Venous 1.87  0.5 - 2.2 mmol/L   Ct Head Wo Contrast  07/26/2013   CLINICAL DATA:  Syncope, vomiting  EXAM: CT HEAD WITHOUT CONTRAST  CT CERVICAL SPINE WITHOUT CONTRAST  TECHNIQUE: Multidetector CT imaging of the head and cervical spine was performed following the standard protocol without intravenous contrast. Multiplanar CT image reconstructions of the cervical spine were also generated.  COMPARISON:  Head CT 04/23/2004  FINDINGS: CT HEAD FINDINGS  No acute intracranial hemorrhage. No focal mass lesion. No CT evidence of acute infarction. No midline shift or mass effect. No hydrocephalus. Basilar cisterns are patent.  No generalized cortical atrophy unchanged. There is periventricular subcortical white matter hypodensities unchanged.  Paranasal sinuses and  mastoid air cells are clear.  CT CERVICAL SPINE FINDINGS  No prevertebral soft tissue swelling. Normal alignment of cervical vertebral bodies. No loss of vertebral body height. Normal facet articulation. Normal craniocervical junction.No evidence epidural or paraspinal hematoma.  There is motion artifact on the sagittal projections at the dens level.  Multilevel disc osteophytic disease. There is biapical pleural parenchymal scarring at the lung apices.  IMPRESSION: 1. No intracranial trauma.  No acute intracranial  findings. 2. Chronic atrophy and white matter microvascular disease. 3. No evidence cervical spine fracture.   Electronically Signed   By: Suzy Bouchard M.D.   On: 07/26/2013 12:39   Ct Cervical Spine Wo Contrast  07/26/2013   CLINICAL DATA:  Syncope, vomiting  EXAM: CT HEAD WITHOUT CONTRAST  CT CERVICAL SPINE WITHOUT CONTRAST  TECHNIQUE: Multidetector CT imaging of the head and cervical spine was performed following the standard protocol without intravenous contrast. Multiplanar CT image reconstructions of the cervical spine were also generated.  COMPARISON:  Head CT 04/23/2004  FINDINGS: CT HEAD FINDINGS  No acute intracranial hemorrhage. No focal mass lesion. No CT evidence of acute infarction. No midline shift or mass effect. No hydrocephalus. Basilar cisterns are patent.  No generalized cortical atrophy unchanged. There is periventricular subcortical white matter hypodensities unchanged.  Paranasal sinuses and  mastoid air cells are clear.  CT CERVICAL SPINE FINDINGS  No prevertebral soft tissue swelling. Normal alignment of cervical vertebral bodies. No loss of vertebral body height. Normal facet articulation. Normal craniocervical junction.No evidence epidural or paraspinal hematoma.  There is motion artifact on the sagittal projections at the dens level.  Multilevel disc osteophytic disease. There is biapical pleural parenchymal scarring at the lung apices.  IMPRESSION: 1. No intracranial trauma.  No acute intracranial findings. 2. Chronic atrophy and white matter microvascular disease. 3. No evidence cervical spine fracture.   Electronically Signed   By: Suzy Bouchard M.D.   On: 07/26/2013 12:39   Dg Chest Portable 1 View  07/26/2013   CLINICAL DATA:  Syncope, vomiting, pale and diaphoretic  EXAM: PORTABLE CHEST - 1 VIEW  COMPARISON:  Portable exam 1130 hr compared to 09/07/2005  FINDINGS: Upper normal heart size.  Atherosclerotic calcification aorta.  Mediastinal contours and pulmonary  vascularity normal.  COPD changes without infiltrate, pleural effusion or pneumothorax.  Diffuse osseous demineralization.  IMPRESSION: COPD changes.  No acute abnormalities.   Electronically Signed   By: Lavonia Dana M.D.   On: 07/26/2013 12:02    Review Of Systems Constitutional: Negative for fever.  Respiratory: Negative for cough and shortness of breath.  Cardiovascular: Positive for syncope.  Gastrointestinal: Positive for nausea and vomiting.  All other systems reviewed and are  negative.   Blood pressure 111/69, pulse 65, resp. rate 20, SpO2 100.00%.  Physical Exam  Nursing note and vitals reviewed.  Constitutional: She appears well-developed and averagely-nourished. No distress.  HENT: Normocephalic and atraumatic. Hazel eyes, pupils are equal, round, and reactive to light. No scleral icterus. Tongue pink and mid-line. Mouth/Throat: Oropharynx is clear and moist. No oropharyngeal exudate.  Neck: 50 % range of motion. Neck supple. Mild posterior tenderness.  Cardiovascular: Regular rhythm. Bradycardia present. Exam reveals no friction rub. II/VI systolic murmur.  Pulmonary/Chest: Effort normal and breath sounds normal. No respiratory distress. She has no wheezes. She has no rales.  Abdominal: Soft. She exhibits no distension. There is no tenderness.   Musculoskeletal: Normal range of motion. She exhibits no edema.  Neurological: She is alert and oriented to person, place, and time. No cranial nerve deficit. She exhibits normal muscle tone. Coordination normal.  Skin: Warm and dry. No rash noted.    Assessment/Plan Syncope Sinus bradycardia Hypertension Hypokalemia Dyslipidemia Neck pain  Admit Hold B-blocker Check TSH Potassium supplement Oxygen Patient prefers medical therapy as much as possible.  Birdie Riddle, MD  07/26/2013, 1:11 PM

## 2013-07-26 NOTE — ED Notes (Addendum)
rn walked in to check on pt, pt had eyes closed and family said pt was asleep. O2 sat 84%, pt placed on 2 L [place], now 100% on 2 L [place]

## 2013-07-26 NOTE — ED Notes (Signed)
Bed: RESA Expected date:  Expected time:  Means of arrival:  Comments: EMS/syncope. 

## 2013-07-26 NOTE — ED Provider Notes (Signed)
CSN: [id number]     Arrival date & time 07/26/13  1100 History   First MD Initiated Contact with Patient 07/26/13 1108     Chief Complaint  Patient presents with  . Loss of Consciousness     (Consider location/radiation/quality/duration/timing/severity/associated sxs/prior Treatment) Patient is a 80 y.o. female presenting with syncope. The history is provided by the patient.  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 No pertinent past medical history. Past Surgical History  Procedure Laterality Date  . Abdominal hysterectomy     No family history on file. History  Substance Use Topics  . Smoking status: Never Smoker   . Smokeless tobacco: Not on file  . Alcohol Use: No   OB History   Grav Para Term Preterm Abortions TAB SAB Ect Mult Living                 Review of Systems  Constitutional: Negative for fever.  Respiratory: Negative for cough and shortness of breath.   Cardiovascular: Positive for syncope.  Gastrointestinal: Positive for nausea and vomiting.  All other systems reviewed and are negative.     Allergies  Norvasc  Home Medications   Prior to Admission medications   Medication Sig Start Date End Date Taking? Authorizing Provider  aspirin 81 MG tablet Take 81 mg by mouth daily.     Historical Provider, MD  calcium citrate-vitamin D (CITRACAL+D) 315-200 MG-UNIT per tablet Take 1 tablet by mouth 2 (two) times daily.    Historical Provider, MD  cholecalciferol (VITAMIN D) 1000 UNITS tablet Take 1,000 Units by mouth daily.    Historical Provider, MD  latanoprost (XALATAN) 0.005 % ophthalmic solution  Place 1 drop into both eyes at bedtime.    Historical Provider, MD  losartan-hydrochlorothiazide Mauri Reading(HYZAAR) 100-25 MG per tablet Take one tablet daily 04/29/13   Collene GobbleSteven A Daub, MD  metoprolol succinate (TOPROL-XL) 25 MG 24 hr tablet take 1 tablet by mouth once daily 05/15/13   Collene GobbleSteven A Daub, MD  pravastatin (PRAVACHOL) 40 MG tablet Take one tablet daily 04/29/13   Collene GobbleSteven A Daub, MD   SpO2 97% Physical Exam  Nursing note and vitals reviewed. Constitutional: She is oriented to person, place, and time. She appears well-developed and well-nourished. No distress.  HENT:  Head: Normocephalic and atraumatic.  Mouth/Throat: Oropharynx is clear and moist. No oropharyngeal exudate.  Eyes: EOM are normal. Pupils are equal, round, and reactive to light.  Neck: Normal range of motion. Neck supple.  Cardiovascular: Regular rhythm.  Bradycardia present.  Exam reveals no friction rub.   No murmur heard. Pulmonary/Chest: Effort normal. No respiratory distress. She has no wheezes. She has no rales.  Abdominal: Soft. She exhibits no distension. There is no tenderness. There is no rebound.  Musculoskeletal: Normal range of motion. She exhibits no edema.  Neurological: She is alert and oriented to person, place, and time. No cranial nerve deficit. She exhibits normal muscle tone. Coordination normal.  Skin: No rash noted. She is not diaphoretic.    ED Course  Procedures (including critical care time) Labs Review Labs Reviewed  CBC  BASIC METABOLIC PANEL  URINALYSIS, ROUTINE W REFLEX MICROSCOPIC  Rosezena Sensor, ED  I-STAT CG4 LACTIC ACID, ED    Imaging Review No results found.   EKG Interpretation   Date/Time:  Sunday July 26 2013 11:17:22 EDT Ventricular Rate:  50 PR Interval:  224 QRS Duration: 83 QT Interval:  455 QTC Calculation: 415 R Axis:   59 Text Interpretation:  Sinus rhythm Atrial premature complex Prolonged PR  interval Bradycardia new Confirmed by Gwendolyn Grant  MD, Elcie Pelster ([id number]) on   07/26/2013 11:25:58 AM      MDM   Final diagnoses:  Symptomatic bradycardia  Syncope and collapse    74F here s/p syncopal episode. Hx one 7 years ago, hx of HTN, otherwise healthy. 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 Diaphoretic and pale with EMS, similar with me. Bradycardic, otherwise vitals stable. Exam with clear lungs, belly benign. Will start with EKG, labs.  Patient persistently becoming bradycardic, unable to capture on EKG. When her HRs hit the 30s, she begins to get nauseated. Patient responds quickly, atropine not quickly. Admitted by Dr. Algie Coffer.  Dagmar Hait, MD 07/26/13 [phone]

## 2013-07-26 NOTE — ED Notes (Signed)
[facility] notified Simvastatin was not given d/t not being received by pharmacy.

## 2013-07-26 NOTE — ED Notes (Signed)
MD at bedside. 

## 2013-07-26 NOTE — ED Notes (Signed)
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 Vomiting present. Pt appears pale and diaphoretic. Pt is alert and oriented.18g Left hand.

## 2013-07-27 LAB — CBC
HCT: 36.6 % (ref 36.0–46.0)
Hemoglobin: 12.2 g/dL (ref 12.0–15.0)
MCH: 30.3 pg (ref 26.0–34.0)
MCHC: 33.3 g/dL (ref 30.0–36.0)
MCV: 90.8 fL (ref 78.0–100.0)
PLATELETS: 137 10*3/uL — AB (ref 150–400)
RBC: 4.03 MIL/uL (ref 3.87–5.11)
RDW: 13.5 % (ref 11.5–15.5)
WBC: 7.7 10*3/uL (ref 4.0–10.5)

## 2013-07-27 LAB — COMPREHENSIVE METABOLIC PANEL
ALBUMIN: 3.6 g/dL (ref 3.5–5.2)
ALK PHOS: 55 U/L (ref 39–117)
ALT: 16 U/L (ref 0–35)
ANION GAP: 11 (ref 5–15)
AST: 33 U/L (ref 0–37)
BUN: 15 mg/dL (ref 6–23)
CALCIUM: 9.5 mg/dL (ref 8.4–10.5)
CO2: 27 mEq/L (ref 19–32)
Chloride: 99 mEq/L (ref 96–112)
Creatinine, Ser: 0.71 mg/dL (ref 0.50–1.10)
GFR calc Af Amer: 83 mL/min — ABNORMAL LOW (ref >90)
GFR calc non Af Amer: 72 mL/min — ABNORMAL LOW (ref >90)
GLUCOSE: 121 mg/dL — AB (ref 70–99)
POTASSIUM: 4.2 meq/L (ref 3.7–5.3)
SODIUM: 137 meq/L (ref 137–147)
Total Bilirubin: 0.6 mg/dL (ref 0.3–1.2)
Total Protein: 6 g/dL (ref 6.0–8.3)

## 2013-07-27 LAB — PROTIME-INR
INR: 1.1 (ref 0.00–1.49)
PROTHROMBIN TIME: 14.2 s (ref 11.6–15.2)

## 2013-07-27 MED ORDER — LOSARTAN POTASSIUM 50 MG PO TABS
100.0000 mg | ORAL_TABLET | Freq: Every day | ORAL | Status: DC
  Administered 2013-07-27 – 2013-07-29 (×3): 100 mg via ORAL
  Filled 2013-07-27 (×5): qty 2

## 2013-07-27 MED ORDER — METOPROLOL TARTRATE 12.5 MG HALF TABLET
12.5000 mg | ORAL_TABLET | Freq: Two times a day (BID) | ORAL | Status: DC
  Administered 2013-07-27 – 2013-07-29 (×4): 12.5 mg via ORAL
  Filled 2013-07-27 (×5): qty 1

## 2013-07-27 NOTE — Progress Notes (Signed)
Ref: Lucilla EdinAUB, STEVE A, MD   Subjective:  Feeling better. Some neck pain. T max 99.3 F. On small dose of levo-thyroxin. Heart rate improves to 70-90/min.  Objective:  Vital Signs in the last 24 hours: Temp:  [97.6 F (36.4 C)-99.3 F (37.4 C)] 99.3 F (37.4 C) (07/13 1606) Pulse Rate:  [57-85] 63 (07/13 1700) Cardiac Rhythm:  [-] Normal sinus rhythm (07/13 1700) Resp:  [17-26] 19 (07/13 1700) BP: (97-171)/(50-82) 148/63 mmHg (07/13 1700) SpO2:  [95 %-99 %] 97 % (07/13 1700) Weight:  [58.5 kg (128 lb 15.5 oz)] 58.5 kg (128 lb 15.5 oz) (07/13 0400)  Physical Exam: BP Readings from Last 1 Encounters:  07/27/13 148/63    Wt Readings from Last 1 Encounters:  07/27/13 58.5 kg (128 lb 15.5 oz)    Weight change:   HEENT: [place]/AT, Eyes-Hazel, PERL, EOMI, Conjunctiva-Pink, Sclera-Non-icteric Neck: No JVD, No bruit, Trachea midline. Tender posteriorly. Lungs:  Clear, Bilateral. Cardiac:  Regular rhythm, normal S1 and S2, no S3. II/VI systolic and diastolic murmur. Abdomen:  Soft, non-tender. Extremities:  No edema present. No cyanosis. No clubbing. CNS: AxOx3, Cranial nerves grossly intact, moves all 4 extremities. Right handed. Skin: Warm and dry.   Intake/Output from previous day: 07/12 0701 - 07/13 0700 In: 819.2 [I.V.:819.2] Out: 575 [Urine:575]    Lab Results: BMET    Component Value Date/Time   NA 137 07/27/2013 0337   NA 135* 07/26/2013 1124   NA 136 04/29/2013 1107   K 4.2 07/27/2013 0337   K 3.6* 07/26/2013 1124   K 4.8 04/29/2013 1107   CL 99 07/27/2013 0337   CL 95* 07/26/2013 1124   CL 98 04/29/2013 1107   CO2 27 07/27/2013 0337   CO2 24 07/26/2013 1124   CO2 31 04/29/2013 1107   GLUCOSE 121* 07/27/2013 0337   GLUCOSE 138* 07/26/2013 1124   GLUCOSE 126* 04/29/2013 1107   BUN 15 07/27/2013 0337   BUN 18 07/26/2013 1124   BUN 19 04/29/2013 1107   CREATININE 0.71 07/27/2013 0337   CREATININE 0.74 07/26/2013 1900   CREATININE 0.75 07/26/2013 1124   CREATININE 0.71 04/29/2013 1107    CREATININE 0.78 07/01/2012 1049   CREATININE 0.79 01/01/2012 1128   CALCIUM 9.5 07/27/2013 0337   CALCIUM 10.5 07/26/2013 1124   CALCIUM 10.1 04/29/2013 1107   GFRNONAA 72* 07/27/2013 0337   GFRNONAA 71* 07/26/2013 1900   GFRNONAA 70* 07/26/2013 1124   GFRAA 83* 07/27/2013 0337   GFRAA 82* 07/26/2013 1900   GFRAA 81* 07/26/2013 1124   CBC    Component Value Date/Time   WBC 7.7 07/27/2013 0337   WBC 5.0 04/29/2013 1109   RBC 4.03 07/27/2013 0337   RBC 4.42 04/29/2013 1109   HGB 12.2 07/27/2013 0337   HGB 13.1 04/29/2013 1109   HCT 36.6 07/27/2013 0337   HCT 41.9 04/29/2013 1109   PLT 137* 07/27/2013 0337   MCV 90.8 07/27/2013 0337   MCV 94.9 04/29/2013 1109   MCH 30.3 07/27/2013 0337   MCH 29.6 04/29/2013 1109   MCHC 33.3 07/27/2013 0337   MCHC 31.3* 04/29/2013 1109   RDW 13.5 07/27/2013 0337   LYMPHSABS 1.8 07/01/2012 1049   MONOABS 0.4 07/01/2012 1049   EOSABS 0.1 07/01/2012 1049   BASOSABS 0.0 07/01/2012 1049   HEPATIC Function Panel  Recent Labs  04/29/13 1107 07/27/13 0337  PROT 6.5 6.0   HEMOGLOBIN A1C No components found with this basename: HGA1C,  MPG   CARDIAC ENZYMES No results found  for this basename: CKTOTAL, CKMB, CKMBINDEX, TROPONINI   BNP No results found for this basename: PROBNP,  in the last 8760 hours TSH  Recent Labs  07/26/13 1300  TSH 7.690*   CHOLESTEROL  Recent Labs  04/29/13 1107  CHOL 168    Scheduled Meds: . aspirin EC  81 mg Oral Daily  . atropine  0.4 mg Intravenous Once  . docusate sodium  100 mg Oral BID  . heparin  5,000 Units Subcutaneous 3 times per day  . latanoprost  1 drop Both Eyes QHS  . levothyroxine  25 mcg Oral QAC breakfast  . losartan  100 mg Oral Daily  . metoprolol tartrate  12.5 mg Oral BID  . potassium chloride  10 mEq Oral BID  . simvastatin  10 mg Oral q1800   Continuous Infusions:  PRN Meds:.acetaminophen, acetaminophen, ondansetron (ZOFRAN) IV, ondansetron  Assessment/Plan: Syncope  Sinus bradycardia   Hypertension  Hypokalemia  Dyslipidemia  Neck pain  Add Levo-thyroxin. Resume small dose B-blocker. Add Losartan without HCTZ. PT evaluation and treatment.   LOS: 1 day    Orpah Cobb  MD  07/27/2013, 6:05 PM

## 2013-07-27 NOTE — Progress Notes (Signed)
Central Tele called dayshift RN to notify that pt had converted from NSR to Afib at 1930.  Paged on call MD at [phone] to notify of change in patient status.  Call returned at 2001 and informed MD that pt had changed from NSR to Afib.  Reviewed VS taken at 1952.  No new orders received at this time.  Will cont to monitor and notify again if any further changes.  ~ Marrion Coyarol Macyn Shropshire, RN

## 2013-07-27 NOTE — Progress Notes (Signed)
Pt converted back to NSR at approx 2100 and has remained NSR since.  Will cont to monitor.  ~ Marrion Coyarol Krisi Azua, RN

## 2013-07-27 NOTE — Progress Notes (Signed)
Utilization Review Completed.Bonnie [name] T7/13/2015  

## 2013-07-27 NOTE — Progress Notes (Signed)
Echocardiogram 2D Echocardiogram has been performed.  Dorothey BasemanReel, Lorrene Graef M 07/27/2013, 11:31 AM

## 2013-07-27 NOTE — Progress Notes (Signed)
CARE MANAGEMENT NOTE 07/27/2013  Patient:  Bonnie [name],Bonnie [name]   Account Number:  [id number]  Date Initiated:  07/27/2013  Documentation initiated by:  [facility]  Subjective/Objective Assessment:   syncope, bradycardia     Action/Plan:   lives alone, waiting PT eval   Anticipated DC Date:     Anticipated DC Plan:  HOME W HOME HEALTH SERVICES      DC Planning Services  CM consult      [facility]   Choice offered to / List presented to:  C-1 Patient           Status of service:  In process, will continue to follow Medicare Important Message given?  YES (If response is "NO", the following Medicare IM given date fields will be blank) Date Medicare IM given:  07/27/2013 Medicare IM given by:  [facility],Ashyla Luth Date Additional Medicare IM given:   Additional Medicare IM given by:    Discharge Disposition:    Per UR Regulation:    If discussed at Long Length of Stay Meetings, dates discussed:    Comments:  07/27/2013 1130 NCM spoke to pt and gave permission to speak with sister, Carlynn HeraldDoris Yeatts [phone]. States she lives at home alone and was independent prior to admission. Pt desires to return home. Waiting final recommendation for home. [facility]  Isidoro DonningAlesia Merrit Waugh RN CCM Case Mgmt phone [phone]

## 2013-07-28 MED ORDER — CIPROFLOXACIN HCL 250 MG PO TABS
250.0000 mg | ORAL_TABLET | Freq: Two times a day (BID) | ORAL | Status: DC
  Administered 2013-07-28 – 2013-07-30 (×5): 250 mg via ORAL
  Filled 2013-07-28 (×6): qty 1

## 2013-07-28 NOTE — Progress Notes (Signed)
Ref: Bonnie Edin, MD   Subjective:  No new complaints. Back in sinus rhythm. T max 99.3 F  Objective:  Vital Signs in the last 24 hours: Temp:  [97.7 F (36.5 C)-99.3 F (37.4 C)] 97.7 F (36.5 C) (07/14 0749) Pulse Rate:  [54-96] 69 (07/14 0749) Cardiac Rhythm:  [-] Sinus bradycardia (07/14 0443) Resp:  [14-25] 23 (07/14 0749) BP: (97-177)/(59-76) 156/76 mmHg (07/14 0749) SpO2:  [95 %-99 %] 98 % (07/14 0749) Weight:  [58 kg (127 lb 13.9 oz)-58.06 kg (128 lb)] 58 kg (127 lb 13.9 oz) (07/14 0443)  Physical Exam: BP Readings from Last 1 Encounters:  07/28/13 156/76    Wt Readings from Last 1 Encounters:  07/28/13 58 kg (127 lb 13.9 oz)    Weight change: -0.44 kg (-15.5 oz)  HEENT: [place]/AT, Eyes-Hazel, PERL, EOMI, Conjunctiva-Pink, Sclera-Non-icteric Neck: No JVD, No bruit, Trachea midline. Lungs:  Clear, Bilateral. Cardiac:  Regular rhythm, normal S1 and S2, no S3. II/VI systolic and diastolic murmur. Abdomen:  Soft, non-tender. Extremities:  No edema present. No cyanosis. No clubbing. CNS: AxOx3, Cranial nerves grossly intact, moves all 4 extremities. Right handed. Skin: Warm and dry.   Intake/Output from previous day: 07/13 0701 - 07/14 0700 In: 490 [P.O.:490] Out: 975 [Urine:975]    Lab Results: BMET    Component Value Date/Time   NA 137 07/27/2013 0337   NA 135* 07/26/2013 1124   NA 136 04/29/2013 1107   K 4.2 07/27/2013 0337   K 3.6* 07/26/2013 1124   K 4.8 04/29/2013 1107   CL 99 07/27/2013 0337   CL 95* 07/26/2013 1124   CL 98 04/29/2013 1107   CO2 27 07/27/2013 0337   CO2 24 07/26/2013 1124   CO2 31 04/29/2013 1107   GLUCOSE 121* 07/27/2013 0337   GLUCOSE 138* 07/26/2013 1124   GLUCOSE 126* 04/29/2013 1107   BUN 15 07/27/2013 0337   BUN 18 07/26/2013 1124   BUN 19 04/29/2013 1107   CREATININE 0.71 07/27/2013 0337   CREATININE 0.74 07/26/2013 1900   CREATININE 0.75 07/26/2013 1124   CREATININE 0.71 04/29/2013 1107   CREATININE 0.78 07/01/2012 1049   CREATININE 0.79  01/01/2012 1128   CALCIUM 9.5 07/27/2013 0337   CALCIUM 10.5 07/26/2013 1124   CALCIUM 10.1 04/29/2013 1107   GFRNONAA 72* 07/27/2013 0337   GFRNONAA 71* 07/26/2013 1900   GFRNONAA 70* 07/26/2013 1124   GFRAA 83* 07/27/2013 0337   GFRAA 82* 07/26/2013 1900   GFRAA 81* 07/26/2013 1124   CBC    Component Value Date/Time   WBC 7.7 07/27/2013 0337   WBC 5.0 04/29/2013 1109   RBC 4.03 07/27/2013 0337   RBC 4.42 04/29/2013 1109   HGB 12.2 07/27/2013 0337   HGB 13.1 04/29/2013 1109   HCT 36.6 07/27/2013 0337   HCT 41.9 04/29/2013 1109   PLT 137* 07/27/2013 0337   MCV 90.8 07/27/2013 0337   MCV 94.9 04/29/2013 1109   MCH 30.3 07/27/2013 0337   MCH 29.6 04/29/2013 1109   MCHC 33.3 07/27/2013 0337   MCHC 31.3* 04/29/2013 1109   RDW 13.5 07/27/2013 0337   LYMPHSABS 1.8 07/01/2012 1049   MONOABS 0.4 07/01/2012 1049   EOSABS 0.1 07/01/2012 1049   BASOSABS 0.0 07/01/2012 1049   HEPATIC Function Panel  Recent Labs  04/29/13 1107 07/27/13 0337  PROT 6.5 6.0   HEMOGLOBIN A1C No components found with this basename: HGA1C,  MPG   CARDIAC ENZYMES No results found for this basename: CKTOTAL, CKMB,  CKMBINDEX, TROPONINI   BNP No results found for this basename: PROBNP,  in the last 8760 hours TSH  Recent Labs  07/26/13 1300  TSH 7.690*   CHOLESTEROL  Recent Labs  04/29/13 1107  CHOL 168    Scheduled Meds: . aspirin EC  81 mg Oral Daily  . atropine  0.4 mg Intravenous Once  . docusate sodium  100 mg Oral BID  . heparin  5,000 Units Subcutaneous 3 times per day  . latanoprost  1 drop Both Eyes QHS  . levothyroxine  25 mcg Oral QAC breakfast  . losartan  100 mg Oral Daily  . metoprolol tartrate  12.5 mg Oral BID  . simvastatin  10 mg Oral q1800   Continuous Infusions:  PRN Meds:.acetaminophen, acetaminophen, ondansetron (ZOFRAN) IV, ondansetron  Assessment/Plan: Syncope  Sinus bradycardia  Hypertension  Hypokalemia-resolved  Dyslipidemia  Neck pain Possible UTI Atrial fibrillation,  paroxysmal  Cipro 250 mg. Bid x 3 days. Increase activity. Home today or tomorrow.     LOS: 2 days    Orpah CobbAjay Hephzibah Strehle  MD  07/28/2013, 8:30 AM

## 2013-07-28 NOTE — Evaluation (Signed)
Physical Therapy Evaluation Patient Details Name: Bonnie [name] MRN: [id number] DOB: [birth year] Today's Date: 07/28/2013   History of Present Illness  Bonnie [name] is a 80 y/o female presenting s/p syncope and collapse lasting 1 minute while standing at Sunday [employer]. Bonnie [name] reports she hurt her neck when she fell.  Clinical Impression  Bonnie [name] admitted with the above. Bonnie [name] currently with functional limitations due to the deficits listed below (see Bonnie [name] Problem List). At the time of Bonnie [name] eval, Bonnie [name] required use of RW for improved balance and safety. Total ambulation ~75'. Bonnie [name] will benefit from skilled Bonnie [name] to increase their independence and safety with mobility to allow discharge to the venue listed below.      Follow Up Recommendations Home health Bonnie [name];Supervision for mobility/OOB    Equipment Recommendations  Rolling walker with 5" wheels    Recommendations for Other Services       Precautions / Restrictions Precautions Precautions: Fall Restrictions Weight Bearing Restrictions: No      Mobility  Bed Mobility Overal bed mobility: Needs Assistance Bed Mobility: Supine to Sit     Supine to sit: Supervision     General bed mobility comments: Increased time to complete transition to EOB. Heavy use of bed rails for support, however no physical assist required. Supervision for safety.   Transfers Overall transfer level: Needs assistance Equipment used: Rolling walker (2 wheeled);None Transfers: Sit to/from Stand Sit to Stand: Min assist;Supervision         General transfer comment: Bonnie [name] stood initially with no AD, and required steadying assist to maintain balance. When standing with the RW, Bonnie [name] required only supervision. VC's for hand placement on seated surface for safety.   Ambulation/Gait Ambulation/Gait assistance: Min guard;Min assist Ambulation Distance (Feet): 75 Feet Assistive device: Rolling walker (2 wheeled);1 person hand held assist Gait Pattern/deviations: Step-through pattern;Decreased  stride length;Shuffle;Trunk flexed;Narrow base of support Gait velocity: Decreased Gait velocity interpretation: Below normal speed for age/gender General Gait Details: Min assist required as Bonnie [name] ambulated ~8 feet with HHA due to unsteadiness. Bonnie [name] with difficulty clearing floor and advancing feet. With min assist, Bonnie [name] was able to ambulate with increased independence and no assist from the Bonnie [name].   Stairs            Wheelchair Mobility    Modified Rankin (Stroke Patients Only)       Balance Overall balance assessment: History of Falls                                           Pertinent Vitals/Pain Vitals stable throughout session on RA.     Home Living Family/patient expects to be discharged to:: Private residence Living Arrangements: Alone Available Help at Discharge: Family;Available 24 hours/day Type of Home: House Home Access: Stairs to enter Entrance Stairs-Rails: Left Entrance Stairs-Number of Steps: 4 Home Layout: One level Home Equipment: None      Prior Function Level of Independence: Independent         Comments: Still drives, does own grocery shopping, cooking and cleaning     Hand Dominance   Dominant Hand: Right    Extremity/Trunk Assessment   Upper Extremity Assessment: Defer to OT evaluation           Lower Extremity Assessment: Generalized weakness      Cervical / Trunk Assessment: Kyphotic  Communication   Communication: HOH  Cognition Arousal/Alertness: Awake/alert Behavior During Therapy: [facility]  for tasks assessed/performed Overall Cognitive Status: Within Functional Limits for tasks assessed                      General Comments      Exercises        Assessment/Plan    Bonnie [name] Assessment Patient needs continued Bonnie [name] services  Bonnie [name] Diagnosis Difficulty walking;Acute pain   Bonnie [name] Problem List Decreased strength;Decreased range of motion;Decreased activity tolerance;Decreased balance;Decreased  mobility;Decreased knowledge of use of DME;Decreased safety awareness;Decreased knowledge of precautions;Pain  Bonnie [name] Treatment Interventions DME instruction;Gait training;Stair training;Functional mobility training;Therapeutic activities;Therapeutic exercise;Neuromuscular re-education;Patient/family education   Bonnie [name] Goals (Current goals can be found in the Care Plan section) Acute Rehab Bonnie [name] Goals Patient Stated Goal: To return to living independently Bonnie [name] Goal Formulation: With patient Time For Goal Achievement: 08/04/13 Potential to Achieve Goals: Good    Frequency Min 3X/week   Barriers to discharge Decreased caregiver support Bonnie [name] lives alone however states sister and nephew will be staying with her. CM to confirm with family members.     Co-evaluation               End of Session Equipment Utilized During Treatment: Gait belt Activity Tolerance: Patient tolerated treatment well Patient left: in chair;with chair alarm set;with call bell/phone within reach Nurse Communication: Mobility status         Time: [id number] Bonnie [name] Time Calculation (min): 27 min   Charges:   Bonnie [name] Evaluation $Initial Bonnie [name] Evaluation Tier I: 1 Procedure Bonnie [name] Treatments $Therapeutic Activity: 8-22 mins   Bonnie [name] G Codes:          Bonnie CancerHamilton, Bonnie [name] 07/28/2013, 9:54 AM  Bonnie [name], Bonnie [name], Bonnie [name] Acute Rehabilitation Services Pager: [phone]

## 2013-07-28 NOTE — Care Management Note (Signed)
    Page 1 of 1   07/28/2013     10:27:47 AM CARE MANAGEMENT NOTE 07/28/2013  Patient:  Bonnie [name],Bonnie [name]   Account Number:  [id number]  Date Initiated:  07/27/2013  Documentation initiated by:  [facility]AV[facility],ALESIA  Subjective/Objective Assessment:   syncope, bradycardia     Action/Plan:   lives alone, waiting PT eval   DC Planning Services  CM consult      [facility]   Choice offered to / List presented to:  C-5 Sibling   DME arranged  [facility]      DME agency  [facility]    HH arranged  HH[facility]2 PT      [facility]   Status of service:  In process, will continue to follow Medicare Important Message given?  YES (If response is "NO", the following Medicare IM given date fields will be blank) Date Medicare IM given:  07/27/2013 Medicare IM given by:  [facility]AL[facility]  Comments:  01/6107 6045:  07/1413 1009 Brittannie Tawney RN MSN BSN CCM PT recommends home therapy and rolling walker.  Per pt and sister, family will be staying with pt until no longer needed.  Provided list of home health agencies to sister - she requested referral to [facility] as they provided services for her in the past.  07/27/2013 1130 NCM spoke to pt and gave permission to speak with sister, Bonnie [name] [phone]. States she lives at home alone and was independent prior to admission. Pt desires to return home. Waiting final recommendation for home. [facility]  Isidoro DonningAlesia Shavis RN CCM Case Mgmt phone [phone]

## 2013-07-29 MED ORDER — METOPROLOL TARTRATE 25 MG PO TABS
25.0000 mg | ORAL_TABLET | Freq: Two times a day (BID) | ORAL | Status: DC
  Administered 2013-07-29: 25 mg via ORAL
  Filled 2013-07-29 (×3): qty 1

## 2013-07-29 MED ORDER — HYDRALAZINE HCL 25 MG PO TABS
25.0000 mg | ORAL_TABLET | Freq: Two times a day (BID) | ORAL | Status: DC
  Administered 2013-07-29 (×2): 25 mg via ORAL
  Filled 2013-07-29 (×4): qty 1

## 2013-07-29 MED ORDER — LEVOTHYROXINE SODIUM 50 MCG PO TABS
50.0000 ug | ORAL_TABLET | Freq: Every day | ORAL | Status: DC
  Administered 2013-07-30: 50 ug via ORAL
  Filled 2013-07-29 (×2): qty 1

## 2013-07-29 NOTE — Progress Notes (Signed)
Pt ambulating in hall, tolerated well with pain in neck and back. Pt heart rate elevated greater than 140 afib while ambulating.  Seraphine Gudiel M. Derrell LollingIngram, RN, BSN 07/29/2013 11:19 AM

## 2013-07-29 NOTE — Progress Notes (Signed)
Ref: Bonnie Edin, MD   Subjective:  Weakness and neck pain persist. No chest pain. Afebrile.  Objective:  Vital Signs in the last 24 hours: Temp:  [97.7 F (36.5 C)-98.6 F (37 C)] 98.6 F (37 C) (07/15 1600) Pulse Rate:  [62-80] 80 (07/15 1600) Cardiac Rhythm:  [-] Atrial fibrillation (07/15 1100) Resp:  [15-24] 20 (07/15 1600) BP: (149-177)/(62-85) 151/85 mmHg (07/15 1200) SpO2:  [95 %-100 %] 98 % (07/15 1600) Weight:  [61.1 kg (134 lb 11.2 oz)] 61.1 kg (134 lb 11.2 oz) (07/15 0404)  Physical Exam: BP Readings from Last 1 Encounters:  07/29/13 151/85    Wt Readings from Last 1 Encounters:  07/29/13 61.1 kg (134 lb 11.2 oz)    Weight change: 3.04 kg (6 lb 11.2 oz)  HEENT: [place]/AT, Eyes-Hazel, PERL, EOMI, Conjunctiva-Pink, Sclera-Non-icteric Neck: No JVD, No bruit, Trachea midline. Lungs:  Clear, Bilateral. Cardiac:  Regular rhythm, normal S1 and S2, no S3. II/VI systolic and diastolic murmur. Abdomen:  Soft, non-tender. Extremities:  No edema present. No cyanosis. No clubbing. CNS: AxOx3, Cranial nerves grossly intact, moves all 4 extremities. Right handed. Skin: Warm and dry.   Intake/Output from previous day: 07/14 0701 - 07/15 0700 In: -  Out: 101 [Urine:100; Stool:1]    Lab Results: BMET    Component Value Date/Time   NA 137 07/27/2013 0337   NA 135* 07/26/2013 1124   NA 136 04/29/2013 1107   K 4.2 07/27/2013 0337   K 3.6* 07/26/2013 1124   K 4.8 04/29/2013 1107   CL 99 07/27/2013 0337   CL 95* 07/26/2013 1124   CL 98 04/29/2013 1107   CO2 27 07/27/2013 0337   CO2 24 07/26/2013 1124   CO2 31 04/29/2013 1107   GLUCOSE 121* 07/27/2013 0337   GLUCOSE 138* 07/26/2013 1124   GLUCOSE 126* 04/29/2013 1107   BUN 15 07/27/2013 0337   BUN 18 07/26/2013 1124   BUN 19 04/29/2013 1107   CREATININE 0.71 07/27/2013 0337   CREATININE 0.74 07/26/2013 1900   CREATININE 0.75 07/26/2013 1124   CREATININE 0.71 04/29/2013 1107   CREATININE 0.78 07/01/2012 1049   CREATININE 0.79  01/01/2012 1128   CALCIUM 9.5 07/27/2013 0337   CALCIUM 10.5 07/26/2013 1124   CALCIUM 10.1 04/29/2013 1107   GFRNONAA 72* 07/27/2013 0337   GFRNONAA 71* 07/26/2013 1900   GFRNONAA 70* 07/26/2013 1124   GFRAA 83* 07/27/2013 0337   GFRAA 82* 07/26/2013 1900   GFRAA 81* 07/26/2013 1124   CBC    Component Value Date/Time   WBC 7.7 07/27/2013 0337   WBC 5.0 04/29/2013 1109   RBC 4.03 07/27/2013 0337   RBC 4.42 04/29/2013 1109   HGB 12.2 07/27/2013 0337   HGB 13.1 04/29/2013 1109   HCT 36.6 07/27/2013 0337   HCT 41.9 04/29/2013 1109   PLT 137* 07/27/2013 0337   MCV 90.8 07/27/2013 0337   MCV 94.9 04/29/2013 1109   MCH 30.3 07/27/2013 0337   MCH 29.6 04/29/2013 1109   MCHC 33.3 07/27/2013 0337   MCHC 31.3* 04/29/2013 1109   RDW 13.5 07/27/2013 0337   LYMPHSABS 1.8 07/01/2012 1049   MONOABS 0.4 07/01/2012 1049   EOSABS 0.1 07/01/2012 1049   BASOSABS 0.0 07/01/2012 1049   HEPATIC Function Panel  Recent Labs  04/29/13 1107 07/27/13 0337  PROT 6.5 6.0   HEMOGLOBIN A1C No components found with this basename: HGA1C,  MPG   CARDIAC ENZYMES No results found for this basename: CKTOTAL, CKMB, CKMBINDEX, TROPONINI  BNP No results found for this basename: PROBNP,  in the last 8760 hours TSH  Recent Labs  07/26/13 1300  TSH 7.690*   CHOLESTEROL  Recent Labs  04/29/13 1107  CHOL 168    Scheduled Meds: . aspirin EC  81 mg Oral Daily  . atropine  0.4 mg Intravenous Once  . ciprofloxacin  250 mg Oral BID  . docusate sodium  100 mg Oral BID  . heparin  5,000 Units Subcutaneous 3 times per day  . hydrALAZINE  25 mg Oral Q12H  . latanoprost  1 drop Both Eyes QHS  . levothyroxine  25 mcg Oral QAC breakfast  . losartan  100 mg Oral Daily  . metoprolol tartrate  12.5 mg Oral BID  . simvastatin  10 mg Oral q1800   Continuous Infusions:  PRN Meds:.acetaminophen, acetaminophen, ondansetron (ZOFRAN) IV, ondansetron  Assessment/Plan: Syncope  Sinus bradycardia  Hypertension   Hypokalemia-resolved  Dyslipidemia  Neck pain  Possible UTI  Atrial fibrillation, paroxysmal  Postpone discharge till tomorrow per patient and family request. Increase B-blocker as tolerated.   LOS: 3 days    Orpah CobbAjay Marcine Gadway  MD  07/29/2013, 6:33 PM

## 2013-07-30 MED ORDER — METOPROLOL TARTRATE 25 MG PO TABS: 25.0000 mg | ORAL_TABLET | Freq: Two times a day (BID) | ORAL | Status: DC

## 2013-07-30 MED ORDER — LOSARTAN POTASSIUM 100 MG PO TABS: 100.0000 mg | ORAL_TABLET | Freq: Every day | ORAL | Status: DC

## 2013-07-30 MED ORDER — HYDRALAZINE HCL 25 MG PO TABS: 25.0000 mg | ORAL_TABLET | Freq: Two times a day (BID) | ORAL | Status: DC

## 2013-07-30 MED ORDER — ASPIRIN 81 MG PO TBEC: 81.0000 mg | DELAYED_RELEASE_TABLET | Freq: Every day | ORAL | Status: AC

## 2013-07-30 MED ORDER — LEVOTHYROXINE SODIUM 50 MCG PO TABS: 50.0000 ug | ORAL_TABLET | Freq: Every day | ORAL | Status: DC

## 2013-07-30 MED ORDER — CIPROFLOXACIN HCL 250 MG PO TABS: 250.0000 mg | ORAL_TABLET | Freq: Two times a day (BID) | ORAL | Status: DC

## 2013-07-30 NOTE — Discharge Summary (Signed)
Physician Discharge Summary  Patient ID: Bonnie [name] MRN: [id number] DOB/AGE: Jan 19, 1917 80 y.o.  Admit date: 07/26/2013 Discharge date: 07/30/2013  Admission Diagnoses: Syncope  Sinus bradycardia  Hypertension   Dyslipidemia  Neck pain   Discharge Diagnoses:  Principle Problem: * Syncope and collapse * Neck pain Sinus bradycardia  Hypertension  Hypokalemia-resolved  Dyslipidemia   Possible UTI  Atrial fibrillation, paroxysmal Pulmonary hypertension, moderate Mild aortic valve regurgitation Mild mitral valve regurgitation  Discharged Condition: fair  Hospital Course: 80 y.o. 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 She had nausea, no speech problem. CT brain and C-spine were without acute finding. She had bradycardia and hypotension. Her beta-blocker and blood pressure medications were held for 48 hours and reintroduced without HCTZ. Levothyroxine was also added for hypothyroidism (elevated TSH). Her neck pain gradually improved. Hydralazine was added for elevated blood pressure secondary to discontinuing HCTZ.  She was advised to use walker all the time. She appears to have good family/friend support to discharge home with follow up by primary care in 2 weeks and by me in 1 month.  Consults: cardiology  Significant Diagnostic Studies: labs: Near normal CBC, BMET. TSH was 7.69. UA was cloudy with small leukocytes.  Echocardiogram showed Left ventricle: The cavity size was normal. There was mild concentric hypertrophy. Systolic function was normal. The estimated ejection fraction was in the range of 60% to 65%. Wall motion was normal; there were no regional wall motion abnormalities. Doppler parameters are consistent with abnormal left ventricular relaxation (grade 1 diastolic dysfunction). - Aortic valve: There was mild regurgitation. - Mitral valve: Calcified annulus. There was mild regurgitation. - Pulmonary arteries: Systolic pressure  was moderately increased. PA peak pressure: 55 mm Hg (S). - Pericardium, extracardiac: A trivial pericardial effusion was identified.  Treatments: IV hydration, antibiotics: Cipro and cardiac meds: aspirin, hydralazine, metoprolol and Losartan.  Discharge Exam: Blood pressure 176/63, pulse 55, temperature 97.9 F (36.6 C), temperature source Oral, resp. rate 18, height 5\' 7"  (1.702 m), weight 62 kg (136 lb 11 oz), SpO2 96.00%. HEENT: [place]/AT, Eyes-Hazel, PERL, EOMI, Conjunctiva-Pink, Sclera-Non-icteric  Neck: No JVD, No bruit, Trachea midline. Tenderness on back of neck. Lungs: Clear, Bilateral.  Cardiac: Regular rhythm, normal S1 and S2, no S3. II/VI systolic and diastolic murmur.  Abdomen: Soft, non-tender.  Extremities: No edema present. No cyanosis. No clubbing.  CNS: AxOx3, Cranial nerves grossly intact, moves all 4 extremities. Right handed.  Skin: Warm and dry.  Disposition: 01, Home and self care.     Medication List    STOP taking these medications       losartan-hydrochlorothiazide 100-25 MG per tablet  Commonly known as:  HYZAAR     metoprolol succinate 25 MG 24 hr tablet  Commonly known as:  TOPROL-XL      TAKE these medications       aspirin 81 MG EC tablet  Take 1 tablet (81 mg total) by mouth daily.     cholecalciferol 1000 UNITS tablet  Commonly known as:  VITAMIN D  Take 1,000 Units by mouth every morning.     ciprofloxacin 250 MG tablet  Commonly known as:  CIPRO  Take 1 tablet (250 mg total) by mouth 2 (two) times daily.     hydrALAZINE 25 MG tablet  Commonly known as:  APRESOLINE  Take 1 tablet (25 mg total) by mouth every 12 (twelve) hours.     latanoprost 0.005 % ophthalmic solution  Commonly known as:  Harrel Lemon  Place 1 drop into both eyes at bedtime.     levothyroxine 50 MCG tablet  Commonly known as:  SYNTHROID, LEVOTHROID  Take 1 tablet (50 mcg total) by mouth daily before breakfast.     losartan 100 MG tablet  Commonly known as:   COZAAR  Take 1 tablet (100 mg total) by mouth daily.     metoprolol tartrate 25 MG tablet  Commonly known as:  LOPRESSOR  Take 1 tablet (25 mg total) by mouth 2 (two) times daily.     pravastatin 40 MG tablet  Commonly known as:  PRAVACHOL  Take 40 mg by mouth every morning.           Follow-up Information   Follow up with DAUB, STEVE A, MD. Schedule an appointment as soon as possible for a visit in 2 weeks.   Specialty:  Family Medicine   Contact information:   [address] [place] [address] [phone]       [facility]Jaloni Davoli S, MD. Schedule an appointment as soon as possible for a visit in 1 month.   Specialty:  Cardiology   Contact information:   [address] [place] [address] [phone]       Signed: Ricki RodriguezKADAKIA,Robinette Esters S 07/30/2013, 8:40 AM

## 2013-07-30 NOTE — Progress Notes (Signed)
PT Cancellation Note  Patient Details Name: Bonnie [name] MRN: [id number] DOB: 03/05/1917   Cancelled Treatment:    Reason Eval/Treat Not Completed: Pt to d/c this morning. When PT entered pt was dressing in street clothes with RN. States she has no further questions/concerns for PT prior to d/c. Will continue to recommend HHPT follow up.   Ruthann CancerHamilton, Shadai Mcclane 07/30/2013, 9:48 AM  Ruthann CancerLaura Hamilton, PT, DPT Acute Rehabilitation Services Pager: [phone]

## 2013-08-03 ENCOUNTER — Telehealth: Payer: Self-pay

## 2013-08-03 NOTE — Telephone Encounter (Signed)
Bonnie [name] a [facility] therapist with [facility] is needing a verbal order for her physical therapy.  [phone]

## 2013-08-04 NOTE — Telephone Encounter (Signed)
Please call with a verbal order to start physical therapy

## 2013-08-05 NOTE — Telephone Encounter (Signed)
Spoke to Mr. Natale MilchLancaster, gave the verbal order per message.  She is aware.

## 2013-08-07 ENCOUNTER — Other Ambulatory Visit: Payer: Self-pay

## 2013-08-07 ENCOUNTER — Ambulatory Visit (INDEPENDENT_AMBULATORY_CARE_PROVIDER_SITE_OTHER): Payer: Medicare Other | Admitting: Emergency Medicine

## 2013-08-07 ENCOUNTER — Ambulatory Visit (HOSPITAL_COMMUNITY)
Admission: RE | Admit: 2013-08-07 | Discharge: 2013-08-07 | Disposition: A | Discharge status: Home / Self Care | Payer: Medicare Other | Source: Ambulatory Visit | Attending: Emergency Medicine | Admitting: Emergency Medicine

## 2013-08-07 ENCOUNTER — Encounter (HOSPITAL_COMMUNITY): Payer: Self-pay | Admitting: Emergency Medicine

## 2013-08-07 ENCOUNTER — Emergency Department (HOSPITAL_COMMUNITY)
Admission: EM | Admit: 2013-08-07 | Discharge: 2013-08-07 | Disposition: A | Discharge status: Home / Self Care | Payer: Medicare Other | Attending: Emergency Medicine | Admitting: Emergency Medicine

## 2013-08-07 VITALS — BP 128/86 | HR 70 | Temp 97.4°F | Resp 18 | Ht 67.0 in | Wt 124.4 lb

## 2013-08-07 DIAGNOSIS — R296 Repeated falls: Secondary | ICD-10-CM | POA: Insufficient documentation

## 2013-08-07 DIAGNOSIS — M542 Cervicalgia: Secondary | ICD-10-CM | POA: Diagnosis present

## 2013-08-07 DIAGNOSIS — M4802 Spinal stenosis, cervical region: Secondary | ICD-10-CM | POA: Insufficient documentation

## 2013-08-07 DIAGNOSIS — Z79899 Other long term (current) drug therapy: Secondary | ICD-10-CM | POA: Insufficient documentation

## 2013-08-07 DIAGNOSIS — S0993XA Unspecified injury of face, initial encounter: Secondary | ICD-10-CM | POA: Insufficient documentation

## 2013-08-07 DIAGNOSIS — R937 Abnormal findings on diagnostic imaging of other parts of musculoskeletal system: Secondary | ICD-10-CM | POA: Diagnosis not present

## 2013-08-07 DIAGNOSIS — S199XXA Unspecified injury of neck, initial encounter: Secondary | ICD-10-CM | POA: Diagnosis present

## 2013-08-07 DIAGNOSIS — Y939 Activity, unspecified: Secondary | ICD-10-CM | POA: Diagnosis not present

## 2013-08-07 DIAGNOSIS — I10 Essential (primary) hypertension: Secondary | ICD-10-CM

## 2013-08-07 DIAGNOSIS — S12100A Unspecified displaced fracture of second cervical vertebra, initial encounter for closed fracture: Secondary | ICD-10-CM | POA: Diagnosis not present

## 2013-08-07 DIAGNOSIS — R55 Syncope and collapse: Secondary | ICD-10-CM | POA: Insufficient documentation

## 2013-08-07 DIAGNOSIS — S12600A Unspecified displaced fracture of seventh cervical vertebra, initial encounter for closed fracture: Secondary | ICD-10-CM | POA: Diagnosis not present

## 2013-08-07 DIAGNOSIS — Z7982 Long term (current) use of aspirin: Secondary | ICD-10-CM | POA: Insufficient documentation

## 2013-08-07 DIAGNOSIS — Y929 Unspecified place or not applicable: Secondary | ICD-10-CM | POA: Diagnosis not present

## 2013-08-07 NOTE — Discharge Instructions (Signed)
Cervical Collar °A cervical collar is used to hold the head and neck still. This may be a soft, thick collar or a more rigid hard collar. This can keep your sore neck muscles from hurting. The collar also protects you in case a more serious injury of the neck is present and can not be seen yet on an x-ray. °FOLLOW THESE INSTRUCTIONS FOR COLLAR USE: °· Attach the Velcro tab in back. °· Make it tight enough to support part of the weight of your chin. °· Do not make it so tight that it hurts or makes it hard to breathe. °· Wear it until you are comfortable without it or as instructed. °HOME CARE INSTRUCTIONS  °· Ice packs to the neck or areas of pain approximately 03-04 times a day for 15-20 minutes while awake. Do this for 2 days. Ask your physician if you may remove the cervical collar for bathing or ice. °· Do not remove any collar unless instructed by a caregiver. Ask if the collar may be removed for showering or eating. °· Only take over-the-counter or prescription medicines for pain, discomfort, or fever as directed by your caregiver. °· Do not drive a car until given permission by your caregiver. °· Follow all instructions for follow-up with your caregiver. This includes any referrals, physical therapy, and rehabilitation. Any delay in obtaining necessary care could result in a delay or failure of the injury to heal properly. °SEEK IMMEDIATE MEDICAL CARE IF:  °· You develop increasing pain. °· You develop problems using your arms or legs or have tingling or other funny feelings in them. °· You develop loss of strength in either of your arms or legs or have difficulty walking. °· You develop a fever or shaking chills. °· You lose control of your bowels or bladder. °MAKE SURE YOU:  °· Understand these instructions. °· Will watch your condition. °· Will get help right away if you are not doing well or get worse. °Document Released: 09/24/2003 Document Revised: 03/26/2011 Document Reviewed: 08/25/2007 °ExitCare®  Patient Information ©2015 [employer]. This information is not intended to replace advice given to you by your health care provider. Make sure you discuss any questions you have with your health care provider. ° °Cervical Spine Fracture, Stable °A cervical spine fracture is a break or crack in one of the bones of the neck. A fracture is stable if the chances of it causing you problems while it is healing are very small. °CAUSES  °· Vehicle accidents. °· Injuries from sports such as diving, football, biking, wrestling, or skiing. °· Occasionally, severe osteoporosis or other bone diseases, such as cancers that spread to bone or metabolic abnormalities. °SYMPTOMS  °· Severe neck pain after an accident or fall. °· Pain down your shoulders or arms. °· Bruising or swelling on the back of your neck. °· Numbness, tingling, muscle spasm, or weakness. °DIAGNOSIS  °Cervical spine fracture is diagnosed with the help of X-ray exams of your neck. Often a CT scan or MRI is used to confirm the diagnosis and help determine how your injury should be treated. Generally, an examination of your neck, arms, and legs, and the history of your injury prompts the health care provider to order these tests.  °TREATMENT  °A stable fracture needs to be treated with a brace or cervical collar. A cervical collar is a two-piece collar designed to keep your neck from moving during the healing process. °HOME CARE INSTRUCTIONS °· Limit physical activity to prevent worsening of the   fracture. °· Apply ice to areas of pain 3-4 times a day for 2 days. °¨ Put ice in a bag. °¨ Place a towel between your skin and the bag. °¨ Leave the ice on for 15-20 minutes, 3-4 times a day. °· You may have been given a cervical collar to wear. °¨ Do not remove the collar unless instructed by your health care provider. °¨ If you have long hair, keep it outside of the collar. °¨ Ask your health care provider before making any adjustments to your collar. Minor adjustments  may be required over time to improve comfort and reduce pressure on your chin or on the back of your head. °¨ Keep your collar clean by wiping it with mild soap and water and drying it completely. The pads can be hand washed with soap and water and air dried completely. °¨ If you are allowed to remove the collar for cleaning or bathing, follow your health care provider's instructions on how to do so safely. °¨ If you are allowed to remove the collar for cleaning and bathing, wash and dry the skin of your neck. Check your skin for irritation or sores. If you see any, tell your health care provider. °· Only take over-the-counter or prescription medicines for pain, discomfort, or fever as directed by your health care provider.   °· Keep all follow-up appointments as directed by your health care provider. Not keeping an appointment could result in a chronic or permanent injury, pain, and disability. Additionally, X-rays or an MRI may be repeated 1-3 weeks after your initial appointment. This is to: °¨ Make sure any other breaks or cracks were not missed.   °¨ Help identify stretched or torn ligaments.   °· Get your test results if you did not get them when you were first evaluated. The results will determine whether you need other tests or treatment. It is your responsibility to get the results. °SEEK MEDICAL CARE IF: °You have irritation or sores on your skin from the cervical collar. °SEEK IMMEDIATE MEDICAL CARE IF:  °· You have increasing pain in your neck.   °· You develop difficulties swallowing or breathing. °· You develop swelling in your neck.   °· You have numbness, weakness, burning pain, or movement problems in the arms or legs.   °· You are unable to control your bowel or bladder (incontinence).   °· You have problems with coordination or difficulty walking. °MAKE SURE YOU:  °· Understand these instructions. °· Will watch your condition. °· Will get help right away if you are not doing well or get  worse. °Document Released: 11/19/2003 Document Revised: 01/06/2013 Document Reviewed: 07/28/2012 °ExitCare® Patient Information ©2015 [employer]. This information is not intended to replace advice given to you by your health care provider. Make sure you discuss any questions you have with your health care provider. ° °

## 2013-08-07 NOTE — ED Notes (Signed)
Dr Cleta Albertsaub called and gave report to triage nurse and md Docherty. Pt was admitted a few weeks ago for fall with neck and head injury, CT at time looked okay. Pt continued to have neck pain. Pt had CT today and was discovered pt has C2 and C7 fracture. Pt brought straight from CT. [place] collar applied upon arrival to ED. Pt reports neck pain 3/10. Denies pain anywhere else. Denies n/v/d.   Pt hand grips bil and strong. Leg pushes and pulls bil and strong. Pt denies urinary or stool incontinence.

## 2013-08-07 NOTE — Patient Instructions (Signed)
Go through admissions at [facility] for CT scan of cervical spine   Cervical Sprain A cervical sprain is an injury in the neck in which the strong, fibrous tissues (ligaments) that connect your neck bones stretch or tear. Cervical sprains can range from mild to severe. Severe cervical sprains can cause the neck vertebrae to be unstable. This can lead to damage of the spinal cord and can result in serious nervous system problems. The amount of time it takes for a cervical sprain to get better depends on the cause and extent of the injury. Most cervical sprains heal in 1 to 3 weeks. CAUSES  Severe cervical sprains may be caused by:   Contact sport injuries (such as from football, rugby, wrestling, hockey, auto racing, gymnastics, diving, martial arts, or boxing).   Motor vehicle collisions.   Whiplash injuries. This is an injury from a sudden forward and backward whipping movement of the head and neck.  Falls.  Mild cervical sprains may be caused by:   Being in an awkward position, such as while cradling a telephone between your ear and shoulder.   Sitting in a chair that does not offer proper support.   Working at a poorly Marketing executive station.   Looking up or down for long periods of time.  SYMPTOMS   Pain, soreness, stiffness, or a burning sensation in the front, back, or sides of the neck. This discomfort may develop immediately after the injury or slowly, 24 hours or more after the injury.   Pain or tenderness directly in the middle of the back of the neck.   Shoulder or upper back pain.   Limited ability to move the neck.   Headache.   Dizziness.   Weakness, numbness, or tingling in the hands or arms.   Muscle spasms.   Difficulty swallowing or chewing.   Tenderness and swelling of the neck.  DIAGNOSIS  Most of the time your health care provider can diagnose a cervical sprain by taking your history and doing a physical exam. Your health care  provider will ask about previous neck injuries and any known neck problems, such as arthritis in the neck. X-rays may be taken to find out if there are any other problems, such as with the bones of the neck. Other tests, such as a CT scan or MRI, may also be needed.  TREATMENT  Treatment depends on the severity of the cervical sprain. Mild sprains can be treated with rest, keeping the neck in place (immobilization), and pain medicines. Severe cervical sprains are immediately immobilized. Further treatment is done to help with pain, muscle spasms, and other symptoms and may include:  Medicines, such as pain relievers, numbing medicines, or muscle relaxants.   Physical therapy. This may involve stretching exercises, strengthening exercises, and posture training. Exercises and improved posture can help stabilize the neck, strengthen muscles, and help stop symptoms from returning.  HOME CARE INSTRUCTIONS   Put ice on the injured area.   Put ice in a plastic bag.   Place a towel between your skin and the bag.   Leave the ice on for 15-20 minutes, 3-4 times a day.   If your injury was severe, you may have been given a cervical collar to wear. A cervical collar is a two-piece collar designed to keep your neck from moving while it heals.  Do not remove the collar unless instructed by your health care provider.  If you have long hair, keep it outside of the collar.  Ask your health care provider before making any adjustments to your collar. Minor adjustments may be required over time to improve comfort and reduce pressure on your chin or on the back of your head.  Ifyou are allowed to remove the collar for cleaning or bathing, follow your health care provider's instructions on how to do so safely.  Keep your collar clean by wiping it with mild soap and water and drying it completely. If the collar you have been given includes removable pads, remove them every 1-2 days and hand wash them with  soap and water. Allow them to air dry. They should be completely dry before you wear them in the collar.  If you are allowed to remove the collar for cleaning and bathing, wash and dry the skin of your neck. Check your skin for irritation or sores. If you see any, tell your health care provider.  Do not drive while wearing the collar.   Only take over-the-counter or prescription medicines for pain, discomfort, or fever as directed by your health care provider.   Keep all follow-up appointments as directed by your health care provider.   Keep all physical therapy appointments as directed by your health care provider.   Make any needed adjustments to your workstation to promote good posture.   Avoid positions and activities that make your symptoms worse.   Warm up and stretch before being active to help prevent problems.  SEEK MEDICAL CARE IF:   Your pain is not controlled with medicine.   You are unable to decrease your pain medicine over time as planned.   Your activity level is not improving as expected.  SEEK IMMEDIATE MEDICAL CARE IF:   You develop any bleeding.  You develop stomach upset.  You have signs of an allergic reaction to your medicine.   Your symptoms get worse.   You develop new, unexplained symptoms.   You have numbness, tingling, weakness, or paralysis in any part of your body.  MAKE SURE YOU:   Understand these instructions.  Will watch your condition.  Will get help right away if you are not doing well or get worse. Document Released: 10/29/2006 Document Revised: 01/06/2013 Document Reviewed: 07/09/2012 [facility] Patient Information 2015 [employer] [place]. This information is not intended to replace advice given to you by your health care provider. Make sure you discuss any questions you have with your health care provider.

## 2013-08-07 NOTE — ED Notes (Signed)
Bed: WA03 Expected date:  Expected time:  Means of arrival:  Comments: Pt coming from CT, Marlene Bastorothy Vanover

## 2013-08-07 NOTE — Progress Notes (Addendum)
Subjective:  This chart was scribed for Viviann Spare A. Saher Davee MD,   by Ashley Jacobs, [facility] Scribe. The patient was seen in room and the patient's care was started at 10:30 AM.  No chief complaint on file.    Patient ID: Bonnie [name], female    DOB: Sep 05, 1917, 80 y.o.   MRN: [id number]  08/07/2013  Follow-up   HPI HPI Comments: Bonnie [name] is a 80 y.o. female who arrives to the [facility] for a follow up of severe neck pain and stiffness after a fall that occurred last week. 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 Per pt's sister, prior to syncope the room that she was in was more heated than usual.  Pt's posterior neck hit a metal stand as she was falling. She was seen at [facility] ED and had a CT scan performed. They were unable to find any fractures or deformities. Pt's HTN medication was reduced during her visit to the ED. Pt was on complete bed rest three days ago. She has PT session twice a week. Pt uses a ice pack yesterday and mentioned feeling better.  She has a past medical hx PVC, HTN, hyperlipidemia, and osteoporosis. Pt last syncope episode was years ago. Denies any other syncopal episodes since that event.    Patient Active Problem List   Diagnosis Date Noted  . Syncope and collapse 07/26/2013  . PVC (premature ventricular contraction) 11/04/2012  . Hypertension 01/01/2012  . Hyperlipidemia 01/01/2012  . Osteoporosis 01/01/2012   No past medical history on file. Past Surgical History  Procedure Laterality Date  . Abdominal hysterectomy     Allergies  Allergen Reactions  . Norvasc [Amlodipine Besylate]    Prior to Admission medications   Medication Sig Start Date End Date Taking? Authorizing Provider  aspirin EC 81 MG EC tablet Take 1 tablet (81 mg total) by mouth daily. 07/30/13   Ricki Rodriguez, MD  cholecalciferol (VITAMIN D) 1000 UNITS tablet Take 1,000 Units by  mouth every morning.     Historical Provider, MD  ciprofloxacin (CIPRO) 250 MG tablet Take 1 tablet (250 mg total) by mouth 2 (two) times daily. 07/30/13   Ricki Rodriguez, MD  hydrALAZINE (APRESOLINE) 25 MG tablet Take 1 tablet (25 mg total) by mouth every 12 (twelve) hours. 07/30/13   Ricki Rodriguez, MD  latanoprost (XALATAN) 0.005 % ophthalmic solution Place 1 drop into both eyes at bedtime.    Historical Provider, MD  levothyroxine (SYNTHROID, LEVOTHROID) 50 MCG tablet Take 1 tablet (50 mcg total) by mouth daily before breakfast. 07/30/13   Ricki Rodriguez, MD  losartan (COZAAR) 100 MG tablet Take 1 tablet (100 mg total) by mouth daily. 07/30/13   Ricki Rodriguez, MD  metoprolol tartrate (LOPRESSOR) 25 MG tablet Take 1 tablet (25 mg total) by mouth 2 (two) times daily. 07/30/13   Ricki Rodriguez, MD  pravastatin (PRAVACHOL) 40 MG tablet Take 40 mg by mouth every morning.    Historical Provider, MD   History   Social History  . Marital Status: Widowed    Spouse Name: N/A    Number of Children: N/A  . Years of Education: N/A   Occupational History  . Not on file.   Social History Main Topics  . Smoking status: Never Smoker   . Smokeless tobacco: Not on file  . Alcohol Use: No  .  Drug Use: Not on file  . Sexual Activity: Not on file   Other Topics Concern  . Not on file   Social History Narrative  . No narrative on file     Review of Systems  Musculoskeletal: Positive for neck pain and neck stiffness.    No past medical history on file. Allergies  Allergen Reactions  . Norvasc [Amlodipine Besylate]    Current Outpatient Prescriptions  Medication Sig Dispense Refill  . aspirin EC 81 MG EC tablet Take 1 tablet (81 mg total) by mouth daily.      . cholecalciferol (VITAMIN D) 1000 UNITS tablet Take 1,000 Units by mouth every morning.       . ciprofloxacin (CIPRO) 250 MG tablet Take 1 tablet (250 mg total) by mouth 2 (two) times daily.  5 tablet  0  . hydrALAZINE (APRESOLINE) 25  MG tablet Take 1 tablet (25 mg total) by mouth every 12 (twelve) hours.  60 tablet  3  . latanoprost (XALATAN) 0.005 % ophthalmic solution Place 1 drop into both eyes at bedtime.      Marland Kitchen. levothyroxine (SYNTHROID, LEVOTHROID) 50 MCG tablet Take 1 tablet (50 mcg total) by mouth daily before breakfast.  30 tablet  3  . losartan (COZAAR) 100 MG tablet Take 1 tablet (100 mg total) by mouth daily.  30 tablet  6  . metoprolol tartrate (LOPRESSOR) 25 MG tablet Take 1 tablet (25 mg total) by mouth 2 (two) times daily.  60 tablet  3  . pravastatin (PRAVACHOL) 40 MG tablet Take 40 mg by mouth every morning.       No current facility-administered medications for this visit.       Objective:     Filed Vitals:   08/07/13 1044  BP: 128/86  Pulse: 70  Temp: 97.4 F (36.3 C)  TempSrc: Axillary  Resp: 18  Height: 5\' 7"  (1.702 m)  Weight: 124 lb 6.4 oz (56.427 kg)  SpO2: 96%    DIAGNOSTIC STUDIES: Oxygen Saturation is 96% on room air, normal by my interpretation.    COORDINATION OF CARE:  10:30 AM Discussed course of care with pt which includes CT cervical spine repeat. Pt understands and agrees.    Physical Exam  CONSTITUTIONAL: Well developed/well nourished she appears to be in pain HEAD: Normocephalic/atraumatic EYES: EOMI/PERRL ENMT: Mucous membranes moist NECK: supple no meningeal signs SPINE: There is significant tenderness bilaterally over the paracervical muscles. She has very limited range of motion flexion extension and rotation to only 10-15. There are no focal neurological signs. Deep tendon reflexes are 2+ motor strength is 5 out of 5 upper and lower extremities. CV: S1/S2 noted, no murmurs/rubs/gallops noted LUNGS: Lungs are clear to auscultation bilaterally, no apparent distress ABDOMEN: soft, nontender, no rebound or guarding NEURO: Pt is awake/alert, moves all extremitiesx4 EXTREMITIES: pulses normal, full ROM SKIN: warm, color normal   PSYCH: no abnormalities of mood  noted      Assessment & Plan:  We'll proceed with a CT of the neck. If no fracture is seen we'll progress to physical therapy of the neck. I did advise her to take extra strength Tylenol up to 4   500 mg capsules per day. She has a followup appointment with Dr. Algie CofferKadakia. She has had no further syncopal episodes previous CT of the neck at the hospital 2 weeks ago did not reveal fracture I received a phone call from the radiologist. Patient has an acute C2 hangman's fracture there is also a suspected  avulsion fracture at the base of C7. There is always concern over the possibility of vertebral artery injury with this type of fracture. She was advised to register in the emergency room for evaluation.   I personally performed the services described in this documentation, which was scribed in my presence. The recorded information has been reviewed and is accurate.

## 2013-08-07 NOTE — ED Provider Notes (Signed)
CSN: [id number]     Arrival date & time 08/07/13  1322 History   First MD Initiated Contact with Patient 08/07/13 1325     Chief Complaint  Patient presents with  . cervical fracture      (Consider location/radiation/quality/duration/timing/severity/associated sxs/prior Treatment) HPI Comments: Pt had CT today and was discovered pt has C2 and C7 fracture. Pt brought straight from CT. She has had neck pain since syncopal episode and fall in 7/12. No numbness, weakness, confusion.   Patient is a 80 y.o. female presenting with neck injury. The history is provided by the patient and a relative. No language interpreter was used.  Neck Injury This is a recurrent problem. The current episode started more than 1 week ago. The problem occurs constantly. The problem has not changed since onset.Pertinent negatives include no chest pain, no abdominal pain, no headaches and no shortness of breath. Exacerbated by: movement. Nothing relieves the symptoms. Treatments tried: PT.    History reviewed. No pertinent past medical history. Past Surgical History  Procedure Laterality Date  . Abdominal hysterectomy     History reviewed. No pertinent family history. History  Substance Use Topics  . Smoking status: Never Smoker   . Smokeless tobacco: Not on file  . Alcohol Use: No   OB History   Grav Para Term Preterm Abortions TAB SAB Ect Mult Living                 Review of Systems  Constitutional: Negative for fever, chills, diaphoresis, activity change, appetite change and fatigue.  HENT: Negative for congestion, facial swelling, rhinorrhea and sore throat.   Eyes: Negative for photophobia and discharge.  Respiratory: Negative for cough, chest tightness and shortness of breath.   Cardiovascular: Negative for chest pain, palpitations and leg swelling.  Gastrointestinal: Negative for nausea, vomiting, abdominal pain and diarrhea.  Endocrine: Negative for polydipsia and polyuria.  Genitourinary:  Negative for dysuria, frequency, difficulty urinating and pelvic pain.  Musculoskeletal: Positive for neck pain and neck stiffness. Negative for arthralgias and back pain.  Skin: Negative for color change and wound.  Allergic/Immunologic: Negative for immunocompromised state.  Neurological: Negative for facial asymmetry, weakness, numbness and headaches.  Hematological: Does not bruise/bleed easily.  Psychiatric/Behavioral: Negative for confusion and agitation.      Allergies  Norvasc  Home Medications   Prior to Admission medications   Medication Sig Start Date End Date Taking? Authorizing Provider  aspirin EC 81 MG EC tablet Take 1 tablet (81 mg total) by mouth daily. 07/30/13  Yes Ricki Rodriguez, MD  cholecalciferol (VITAMIN D) 1000 UNITS tablet Take 1,000 Units by mouth every morning.    Yes Historical Provider, MD  hydrALAZINE (APRESOLINE) 25 MG tablet Take 1 tablet (25 mg total) by mouth every 12 (twelve) hours. 07/30/13  Yes Ricki Rodriguez, MD  latanoprost (XALATAN) 0.005 % ophthalmic solution Place 1 drop into both eyes at bedtime.   Yes Historical Provider, MD  levothyroxine (SYNTHROID, LEVOTHROID) 50 MCG tablet Take 1 tablet (50 mcg total) by mouth daily before breakfast. 07/30/13  Yes Ricki Rodriguez, MD  losartan (COZAAR) 100 MG tablet Take 1 tablet (100 mg total) by mouth daily. 07/30/13  Yes Ricki Rodriguez, MD  metoprolol tartrate (LOPRESSOR) 25 MG tablet Take 1 tablet (25 mg total) by mouth 2 (two) times daily. 07/30/13  Yes Ricki Rodriguez, MD  pravastatin (PRAVACHOL) 40 MG tablet Take 40 mg by mouth every morning.   Yes Historical Provider, MD  BP 149/59  Pulse 46  Temp(Src) 97.5 F (36.4 C) (Oral)  Resp 16  SpO2 97% Physical Exam  Constitutional: She is oriented to person, place, and time. She appears well-developed and well-nourished. No distress.  HENT:  Head: Normocephalic and atraumatic.  Mouth/Throat: No oropharyngeal exudate.  Eyes: Pupils are equal, round, and  reactive to light.  Neck:  In [place]. No reproducible tenderness  Cardiovascular: Normal rate, regular rhythm and normal heart sounds.  Exam reveals no gallop and no friction rub.   No murmur heard. Pulmonary/Chest: Effort normal and breath sounds normal. No respiratory distress. She has no wheezes. She has no rales.  Abdominal: Soft. Bowel sounds are normal. She exhibits no distension and no mass. There is no tenderness. There is no rebound and no guarding.  Musculoskeletal: Normal range of motion. She exhibits no edema and no tenderness.  Neurological: She is alert and oriented to person, place, and time. She has normal strength. She displays no atrophy and no tremor. No cranial nerve deficit or sensory deficit. She exhibits normal muscle tone. Coordination normal. GCS eye subscore is 4. GCS verbal subscore is 5. GCS motor subscore is 6.  Skin: Skin is warm and dry.  Psychiatric: She has a normal mood and affect.    ED Course  Procedures (including critical care time) Labs Review Labs Reviewed - No data to display  Imaging Review Ct Cervical Spine Wo Contrast  08/07/2013   CLINICAL DATA:  Persistent neck pain.  Fall 2 weeks ago.  Syncope.  EXAM: CT CERVICAL SPINE WITHOUT CONTRAST  TECHNIQUE: Multidetector CT imaging of the cervical spine was performed without intravenous contrast. Multiplanar CT image reconstructions were also generated.  COMPARISON:  07/26/2013  FINDINGS: Persistence of abnormal cortical discontinuity at the base of the odontoid, lateral portions of the C2 vertebral body, and superior articulation of C2 with the lateral masses of C1 indicates that the subtle cortical irregularity in this vicinity on the prior exam was not solely due to motion artifact, but rather that there is an underlying hangman's fracture with extension into the transverse foramina. Apex posterior angulation at the fracture site without C2-3 subluxation, and somewhat horizontal/ oblique orientation of the  fracture planes which include the vertebral body. Elevation of the periosteum along the anterior base of the C2 vertebra.  The C2-3 facet joint is fused on the right side. There has been no interval significant subluxation.  There is anterior superior cortical irregularity at the C7 level along with new interval sclerosis and potentially slight shortening of vertebral height. Although this cortical irregularity resembles the chronically fragmented spurs at other levels, a hyperflexion avulsion injury is thought to be present. No other C7 fracture is identified.  There is approximately 1.5 mm of likely degenerative anterolisthesis at C4-5 and C5-6.  Additional findings at individual levels are as follows:  C2-3:  No osseous impingement.  C3-4: Moderate bilateral foraminal stenosis due to facet and not could uncinate spurring.  C4-5:  No overt osseous impingement.  Left facet arthropathy.  C5-6:  No overt impingement.  C6-7:  No overt impingement.  C7-T1:  No impingement identified.  IMPRESSION: 1. Acute C2 hangman's fracture, not significantly displaced, with an oblique orientation of the fractures through the vertebral body/pars and no significant anterolisthesis at C2-3. Fracture planes appear to extend into the transverse foramina bilaterally -if the patient has abnormal neurologic findings referable to the vertebral arteries, then neck CT angiogram may be warranted to rule out vertebral artery dissection. Cervical collar  fixation recommended pending assessment in the emergency room or by neurology/neurosurgery. 2. Suspected hyperflexion avulsion fracture along the anterior superior endplate of C7. Critical Value/emergent results were called by telephone at the time of interpretation on 08/07/2013 at 1:12 pm to Dr. Lesle ChrisSTEVEN DAUB , who verbally acknowledged these results.   Electronically Signed   By: Herbie BaltimoreWalt  Liebkemann M.D.   On: 08/07/2013 13:18     EKG Interpretation None      MDM   Final diagnoses:  C2  cervical fracture, closed, initial encounter  C7 cervical fracture, closed, initial encounter   Pt is a 80 y.o. female with Pmhx as above who presents with C2 and C7 fractures, likely from fall that occurred on 7/12 with syncopal episode. CT at that time w/o acute findings, but artifact was obscuring dens. Fractures seen on repeat CT today ordered for continued neck pain. Aspen placed upon arrival. She has no focal neuro complaints or findings on PE, no reproducible neck pain w/ palpation.  Spoke w/ Dr.Nundkumar with [facility] who will see her in f/u next week. SHe is to keep collar on at all times, d/c her PT. Return precautions given for new or worsening symptoms including focal neuro symptoms. She does not want pain meds.          Shanna CiscoMegan E Docherty, MD 08/07/13 [phone]

## 2013-08-10 ENCOUNTER — Telehealth: Payer: Self-pay | Admitting: Family Medicine

## 2013-08-10 NOTE — Telephone Encounter (Signed)
Bonnie [name] from [facility] called: although they are discontinuing her physical therapy, she would like to know if they can continue to work on her balance and safety in the home. Please advise [phone]

## 2013-08-11 NOTE — Telephone Encounter (Signed)
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 She needs to be sure she is in a brace. There should be no physical therapy performed on her neck

## 2013-08-18 NOTE — Telephone Encounter (Signed)
Spoke to [place], verbal order given per previous note. Melissa was under the impression from the family pt would not be continuing with therapy. Melissa states she will discuss this with the family as she wouod like to go to pts home a few more times to ensure pt is safe in her home.

## 2013-08-18 NOTE — Telephone Encounter (Signed)
MELISSA LANCASTER FROM [facility] IS CALLING BACK ON BEHALF OF PATIENT. SHE STATES SHE HASN'T HEARD ANYTHING BACK IN A WEEK AND SHE NEEDS CLARIFICATION ON IF THE PATEINT NEEDS TO CONTINUE WITH THERAPY AND BALANCE TRAINING DUE TO THE RESULTS OF AN MRI. PLEASE CALL MELISSA AT [phone]

## 2013-08-18 NOTE — Telephone Encounter (Signed)
Lm for Melissa with instructions from Dr. Cleta Albertsaub.

## 2013-09-29 ENCOUNTER — Ambulatory Visit: Payer: Medicare Other | Admitting: Emergency Medicine

## 2014-03-19 ENCOUNTER — Other Ambulatory Visit: Payer: Self-pay | Admitting: Emergency Medicine

## 2014-03-19 NOTE — Telephone Encounter (Signed)
Pt no showed to her appt, can we refill?

## 2014-04-11 ENCOUNTER — Other Ambulatory Visit: Payer: Self-pay | Admitting: Emergency Medicine

## 2014-04-22 ENCOUNTER — Other Ambulatory Visit: Payer: Self-pay | Admitting: Emergency Medicine

## 2014-05-03 ENCOUNTER — Other Ambulatory Visit: Payer: Self-pay | Admitting: Emergency Medicine

## 2014-06-26 ENCOUNTER — Other Ambulatory Visit: Payer: Self-pay | Admitting: Emergency Medicine

## 2014-06-28 ENCOUNTER — Emergency Department (HOSPITAL_COMMUNITY)
Admission: EM | Admit: 2014-06-28 | Discharge: 2014-06-28 | Disposition: A | Discharge status: Home / Self Care | Payer: Medicare Other | Attending: Emergency Medicine | Admitting: Emergency Medicine

## 2014-06-28 ENCOUNTER — Ambulatory Visit (INDEPENDENT_AMBULATORY_CARE_PROVIDER_SITE_OTHER): Payer: Medicare Other | Admitting: Emergency Medicine

## 2014-06-28 ENCOUNTER — Emergency Department (HOSPITAL_COMMUNITY): Payer: Medicare Other

## 2014-06-28 ENCOUNTER — Encounter: Payer: Self-pay | Admitting: Emergency Medicine

## 2014-06-28 ENCOUNTER — Encounter (HOSPITAL_COMMUNITY): Payer: Self-pay | Admitting: Emergency Medicine

## 2014-06-28 VITALS — BP 122/72 | HR 82 | Temp 98.4°F | Resp 16 | Ht 66.5 in | Wt 91.6 lb

## 2014-06-28 DIAGNOSIS — Z79899 Other long term (current) drug therapy: Secondary | ICD-10-CM | POA: Diagnosis not present

## 2014-06-28 DIAGNOSIS — E860 Dehydration: Secondary | ICD-10-CM | POA: Diagnosis not present

## 2014-06-28 DIAGNOSIS — E8889 Other specified metabolic disorders: Secondary | ICD-10-CM

## 2014-06-28 DIAGNOSIS — E872 Acidosis: Secondary | ICD-10-CM | POA: Diagnosis not present

## 2014-06-28 DIAGNOSIS — R5382 Chronic fatigue, unspecified: Secondary | ICD-10-CM | POA: Insufficient documentation

## 2014-06-28 DIAGNOSIS — R634 Abnormal weight loss: Secondary | ICD-10-CM | POA: Insufficient documentation

## 2014-06-28 DIAGNOSIS — R531 Weakness: Secondary | ICD-10-CM | POA: Insufficient documentation

## 2014-06-28 DIAGNOSIS — Z7982 Long term (current) use of aspirin: Secondary | ICD-10-CM | POA: Diagnosis not present

## 2014-06-28 LAB — POCT CBC
GRANULOCYTE PERCENT: 61.9 % (ref 37–80)
HCT, POC: 42.9 % (ref 37.7–47.9)
Hemoglobin: 13.9 g/dL (ref 12.2–16.2)
Lymph, poc: 3.5 — AB (ref 0.6–3.4)
MCH, POC: 29.8 pg (ref 27–31.2)
MCHC: 32.4 g/dL (ref 31.8–35.4)
MCV: 92 fL (ref 80–97)
MID (CBC): 0.6 (ref 0–0.9)
MPV: 9.1 fL (ref 0–99.8)
POC GRANULOCYTE: 6.6 (ref 2–6.9)
POC LYMPH PERCENT: 32.4 %L (ref 10–50)
POC MID %: 5.7 %M (ref 0–12)
Platelet Count, POC: 163 10*3/uL (ref 142–424)
RBC: 4.66 M/uL (ref 4.04–5.48)
RDW, POC: 15.5 %
WBC: 10.7 10*3/uL — AB (ref 4.6–10.2)

## 2014-06-28 LAB — COMPREHENSIVE METABOLIC PANEL
ALK PHOS: 41 U/L (ref 38–126)
ALT: 13 U/L — AB (ref 14–54)
AST: 26 U/L (ref 15–41)
Albumin: 4.1 g/dL (ref 3.5–5.0)
Anion gap: 10 (ref 5–15)
BUN: 16 mg/dL (ref 6–20)
CALCIUM: 10.8 mg/dL — AB (ref 8.9–10.3)
CO2: 33 mmol/L — ABNORMAL HIGH (ref 22–32)
Chloride: 98 mmol/L — ABNORMAL LOW (ref 101–111)
Creatinine, Ser: 0.92 mg/dL (ref 0.44–1.00)
GFR calc non Af Amer: 51 mL/min — ABNORMAL LOW (ref >60)
GFR, EST AFRICAN AMERICAN: 59 mL/min — AB (ref >60)
GLUCOSE: 105 mg/dL — AB (ref 65–99)
Potassium: 3.7 mmol/L (ref 3.5–5.1)
Sodium: 141 mmol/L (ref 135–145)
TOTAL PROTEIN: 6.6 g/dL (ref 6.5–8.1)
Total Bilirubin: 0.5 mg/dL (ref 0.3–1.2)

## 2014-06-28 LAB — CBC WITH DIFFERENTIAL/PLATELET
BASOS ABS: 0 10*3/uL (ref 0.0–0.1)
Basophils Relative: 0 % (ref 0–1)
EOS PCT: 0 % (ref 0–5)
Eosinophils Absolute: 0 10*3/uL (ref 0.0–0.7)
HCT: 40.1 % (ref 36.0–46.0)
Hemoglobin: 13 g/dL (ref 12.0–15.0)
LYMPHS PCT: 36 % (ref 12–46)
Lymphs Abs: 2.3 10*3/uL (ref 0.7–4.0)
MCH: 30.5 pg (ref 26.0–34.0)
MCHC: 32.4 g/dL (ref 30.0–36.0)
MCV: 94.1 fL (ref 78.0–100.0)
Monocytes Absolute: 0.4 10*3/uL (ref 0.1–1.0)
Monocytes Relative: 6 % (ref 3–12)
NEUTROS PCT: 58 % (ref 43–77)
Neutro Abs: 3.8 10*3/uL (ref 1.7–7.7)
PLATELETS: 133 10*3/uL — AB (ref 150–400)
RBC: 4.26 MIL/uL (ref 3.87–5.11)
RDW: 14.1 % (ref 11.5–15.5)
WBC: 6.5 10*3/uL (ref 4.0–10.5)

## 2014-06-28 LAB — URINE MICROSCOPIC-ADD ON

## 2014-06-28 LAB — TSH: TSH: 3.137 u[IU]/mL (ref 0.350–4.500)

## 2014-06-28 LAB — URINALYSIS, ROUTINE W REFLEX MICROSCOPIC
BILIRUBIN URINE: NEGATIVE
Glucose, UA: NEGATIVE mg/dL
HGB URINE DIPSTICK: NEGATIVE
Ketones, ur: NEGATIVE mg/dL
Nitrite: NEGATIVE
PROTEIN: NEGATIVE mg/dL
Specific Gravity, Urine: 1.007 (ref 1.005–1.030)
UROBILINOGEN UA: 1 mg/dL (ref 0.0–1.0)
pH: 6.5 (ref 5.0–8.0)

## 2014-06-28 LAB — GLUCOSE, POCT (MANUAL RESULT ENTRY): POC GLUCOSE: 161 mg/dL — AB (ref 70–99)

## 2014-06-28 NOTE — ED Provider Notes (Signed)
CSN: [id number]     Arrival date & time 06/28/14  1402 History   First MD Initiated Contact with Patient 06/28/14 1503     Chief Complaint  Patient presents with  . Weakness  . Weight Loss     (Consider location/radiation/quality/duration/timing/severity/associated sxs/prior Treatment) HPI Patient is sent from primary care physician's office for weight loss and general weakness. This was actually an annual checkup for the patient. She denies she's been having any focusing symptoms. She does however note that she has been having decreasing appetite and feeling generally weaker with activities over a period of months. Her sister reports that the doctor noted a "sweet smell on her breath" and was concerned for dehydration. 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 She has been active and continues bowl. She does note however over the past number of months her activity level is decreased. History reviewed. No pertinent past medical history. Past Surgical History  Procedure Laterality Date  . Abdominal hysterectomy     No family history on file. History  Substance Use Topics  . Smoking status: Never Smoker   . Smokeless tobacco: Not on file  . Alcohol Use: No   OB History    No data available     Review of Systems  10 Systems reviewed and are negative for acute change except as noted in the HPI.   Allergies  Norvasc  Home Medications   Prior to Admission medications   Medication Sig Start Date End Date Taking? Authorizing Provider  aspirin EC 81 MG EC tablet Take 1 tablet (81 mg total) by mouth daily. 07/30/13  Yes Orpah Cobb, MD  cholecalciferol (VITAMIN D) 1000 UNITS tablet Take 1,000 Units by mouth every morning.    Yes Historical Provider, MD  hydrALAZINE (APRESOLINE) 25 MG tablet Take 1 tablet (25 mg total) by mouth every 12 (twelve) hours. 07/30/13  Yes Orpah Cobb, MD  latanoprost (XALATAN) 0.005 % ophthalmic solution Place 1 drop into both eyes at  bedtime.   Yes Historical Provider, MD  levothyroxine (SYNTHROID, LEVOTHROID) 50 MCG tablet Take 1 tablet (50 mcg total) by mouth daily before breakfast. PATIENT NEEDS OFFICE VISIT/LABS FOR ADDITIONAL REFILLS Patient taking differently: Take 50 mcg by mouth daily before breakfast.  04/22/14  Yes Collene Gobble, MD  losartan (COZAAR) 100 MG tablet take 1 tablet by mouth once daily Patient taking differently: TAKE 100 MG BY MOUTH ONCE DAILY 03/19/14  Yes Collene Gobble, MD  metoprolol tartrate (LOPRESSOR) 25 MG tablet Take 1 tablet (25 mg total) by mouth 2 (two) times daily. PATIENT NEEDS OFFICE VISIT FOR ADDITIONAL REFILLS Patient taking differently: Take 25 mg by mouth 2 (two) times daily.  05/04/14  Yes Collene Gobble, MD  pravastatin (PRAVACHOL) 40 MG tablet take 1 tablet by mouth once daily Patient taking differently: TAKE 40 MG BY MOUTH ONCE DAILY 04/11/14  Yes Collene Gobble, MD   BP 106/60 mmHg  Pulse 63  Temp(Src) 97.4 F (36.3 C) (Oral)  Resp 18  SpO2 100% Physical Exam  Constitutional: She is oriented to person, place, and time.  Patient is cachectic but alert with clear mental status and no respiratory distress. Her general appearance is otherwise well.  HENT:  Head: Normocephalic and atraumatic.  Nose: Nose normal.  Mouth/Throat: Oropharynx is clear and moist.  Geographic tongue.  Eyes: EOM are normal. Pupils are equal, round, and reactive to light.  Neck: Neck supple.  Cardiovascular: Normal rate, regular  rhythm, normal heart sounds and intact distal pulses.   Pulmonary/Chest: Effort normal and breath sounds normal. No respiratory distress.  Abdominal: Soft. Bowel sounds are normal. She exhibits no distension. There is no tenderness.  Musculoskeletal: Normal range of motion. She exhibits no edema.  Neurological: She is alert and oriented to person, place, and time. She has normal strength. She exhibits normal muscle tone. Coordination normal. GCS eye subscore is 4. GCS verbal subscore  is 5. GCS motor subscore is 6.  Skin: Skin is warm, dry and intact.  Psychiatric: She has a normal mood and affect.    ED Course  Procedures (including critical care time) Labs Review Labs Reviewed  COMPREHENSIVE METABOLIC PANEL - Abnormal; Notable for the following:    Chloride 98 (*)    CO2 33 (*)    Glucose, Bld 105 (*)    Calcium 10.8 (*)    ALT 13 (*)    GFR calc non Af Amer 51 (*)    GFR calc Af Amer 59 (*)    All other components within normal limits  CBC WITH DIFFERENTIAL/PLATELET - Abnormal; Notable for the following:    Platelets 133 (*)    All other components within normal limits  URINALYSIS, ROUTINE W REFLEX MICROSCOPIC (NOT AT [facility]) - Abnormal; Notable for the following:    Leukocytes, UA SMALL (*)    All other components within normal limits  URINE MICROSCOPIC-ADD ON - Abnormal; Notable for the following:    Bacteria, UA FEW (*)    All other components within normal limits  TSH    Imaging Review Dg Chest 2 View  06/28/2014   CLINICAL DATA:  Weight loss.  Increased weakness.  EXAM: CHEST  2 VIEW  COMPARISON:  July 26, 2013.  FINDINGS: Stable cardiomediastinal silhouette. No pneumothorax is noted. Interval development of mild right pleural effusion is noted with probable associated subsegmental atelectasis. Left lung is clear. Bony thorax is intact.  IMPRESSION: Interval development of mild right pleural effusion with probable associated subsegmental atelectasis.   Electronically Signed   By: Lupita Raider, M.D.   On: 06/28/2014 15:50     EKG Interpretation   Date/Time:  Monday June 28 2014 15:45:06 EDT Ventricular Rate:  67 PR Interval:  195 QRS Duration: 83 QT Interval:  379 QTC Calculation: 400 R Axis:   33 Text Interpretation:  Sinus rhythm Supraventricular bigeminy Anteroseptal  infarct, old Confirmed by Donnald Garre, MD, Lebron Conners [phone]) on 06/28/2014 5:06:12  PM      MDM   Final diagnoses:  Weight loss  Chronic fatigue   The patient general  appearance as well. Her mental status is clear. She is on blood pressure medications and I will advise her to [place] time to consult her physician for possible dose adjustments, she states she has medications to fill at the pharmacy as new prescriptions. She is advised not to take the evening dose and to contact her doctor tomorrow.    Arby Barrette, MD 06/28/14 [phone]

## 2014-06-28 NOTE — ED Notes (Signed)
Patient transported to CT 

## 2014-06-28 NOTE — ED Notes (Signed)
Pt has been having increased weakness, has lost about 30 pounds since last July. Denies pain and SOB.

## 2014-06-28 NOTE — Discharge Instructions (Signed)
Fatigue Your blood pressure medications may contribute to weakness and fatigue. Do not take your evening blood pressure medications. Have your doctor recheck your blood pressure in the office several times this week and make recommendations for ongoing use of blood pressure medications.   Fatigue is a feeling of tiredness, lack of energy, lack of motivation, or feeling tired all the time. Having enough rest, good nutrition, and reducing stress will normally reduce fatigue. Consult your caregiver if it persists. The nature of your fatigue will help your caregiver to find out its cause. The treatment is based on the cause.  CAUSES  There are many causes for fatigue. Most of the time, fatigue can be traced to one or more of your habits or routines. Most causes fit into one or more of three general areas. They are: Lifestyle problems  Sleep disturbances.  Overwork.  Physical exertion.  Unhealthy habits.  Poor eating habits or eating disorders.  Alcohol and/or drug use .  Lack of proper nutrition (malnutrition). Psychological problems  Stress and/or anxiety problems.  Depression.  Grief.  Boredom. Medical Problems or Conditions  Anemia.  Pregnancy.  Thyroid gland problems.  Recovery from major surgery.  Continuous pain.  Emphysema or asthma that is not well controlled  Allergic conditions.  Diabetes.  Infections (such as mononucleosis).  Obesity.  Sleep disorders, such as sleep apnea.  Heart failure or other heart-related problems.  Cancer.  Kidney disease.  Liver disease.  Effects of certain medicines such as antihistamines, cough and cold remedies, prescription pain medicines, heart and blood pressure medicines, drugs used for treatment of cancer, and some antidepressants. SYMPTOMS  The symptoms of fatigue include:   Lack of energy.  Lack of drive (motivation).  Drowsiness.  Feeling of indifference to the surroundings. DIAGNOSIS  The details of  how you feel help guide your caregiver in finding out what is causing the fatigue. You will be asked about your present and past health condition. It is important to review all medicines that you take, including prescription and non-prescription items. A thorough exam will be done. You will be questioned about your feelings, habits, and normal lifestyle. Your caregiver may suggest blood tests, urine tests, or other tests to look for common medical causes of fatigue.  TREATMENT  Fatigue is treated by correcting the underlying cause. For example, if you have continuous pain or depression, treating these causes will improve how you feel. Similarly, adjusting the dose of certain medicines will help in reducing fatigue.  HOME CARE INSTRUCTIONS   Try to get the required amount of good sleep every night.  Eat a healthy and nutritious diet, and drink enough water throughout the day.  Practice ways of relaxing (including yoga or meditation).  Exercise regularly.  Make plans to change situations that cause stress. Act on those plans so that stresses decrease over time. Keep your work and personal routine reasonable.  Avoid street drugs and minimize use of alcohol.  Start taking a daily multivitamin after consulting your caregiver. SEEK MEDICAL CARE IF:   You have persistent tiredness, which cannot be accounted for.  You have fever.  You have unintentional weight loss.  You have headaches.  You have disturbed sleep throughout the night.  You are feeling sad.  You have constipation.  You have dry skin.  You have gained weight.  You are taking any new or different medicines that you suspect are causing fatigue.  You are unable to sleep at night.  You develop any unusual swelling  of your legs or other parts of your body. SEEK IMMEDIATE MEDICAL CARE IF:   You are feeling confused.  Your vision is blurred.  You feel faint or pass out.  You develop severe headache.  You develop  severe abdominal, pelvic, or back pain.  You develop chest pain, shortness of breath, or an irregular or fast heartbeat.  You are unable to pass a normal amount of urine.  You develop abnormal bleeding such as bleeding from the rectum or you vomit blood.  You have thoughts about harming yourself or committing suicide.  You are worried that you might harm someone else. MAKE SURE YOU:   Understand these instructions.  Will watch your condition.  Will get help right away if you are not doing well or get worse. Document Released: 10/29/2006 Document Revised: 03/26/2011 Document Reviewed: 05/05/2013 [facility] Patient Information 2015 [place], [place]. This information is not intended to replace advice given to you by your health care provider. Make sure you discuss any questions you have with your health care provider.
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
## 2014-06-29 ENCOUNTER — Telehealth: Payer: Self-pay

## 2014-06-29 NOTE — Telephone Encounter (Signed)
Spoke with pt's sister, she states she was told to go home from the ER. Her blood pressure was low and they advised her not to take blood pressure medications. I advised Bonnie [name] to bring pt in tomorrow to see Dr. Cleta Alberts so we can discuss. She agreed.

## 2014-06-29 NOTE — Telephone Encounter (Signed)
Doris would like to speak with Dr.Daub regarding her sister, states she have some information to give him Please call [phone]

## 2014-06-30 ENCOUNTER — Ambulatory Visit (INDEPENDENT_AMBULATORY_CARE_PROVIDER_SITE_OTHER): Payer: Medicare Other | Admitting: Emergency Medicine

## 2014-06-30 VITALS — BP 86/60 | HR 60 | Temp 98.1°F | Resp 20 | Ht 66.5 in | Wt 90.8 lb

## 2014-06-30 DIAGNOSIS — J90 Pleural effusion, not elsewhere classified: Secondary | ICD-10-CM | POA: Diagnosis not present

## 2014-06-30 DIAGNOSIS — R634 Abnormal weight loss: Secondary | ICD-10-CM

## 2014-06-30 DIAGNOSIS — I952 Hypotension due to drugs: Secondary | ICD-10-CM | POA: Diagnosis not present

## 2014-06-30 DIAGNOSIS — I10 Essential (primary) hypertension: Secondary | ICD-10-CM
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
## 2014-06-30 NOTE — Patient Instructions (Addendum)
Stop your  Cozaar/ losartan. Stop your hydralazine You are being scheduled for a CT of the abdomen. And a CT of your chestPleural Effusion The lining covering your lungs and the inside of your chest is called the pleura. Usually, the space between the two pleura contains no air and only a thin layer of fluid. A pleural effusion is an abnormal buildup of fluid in the pleural space. Fluid gathers when there is increased pressure in the lung vessels. This forces fluids out of the lungs and into the pleural space. Vessels may also leak fluids when there are infections, such as pneumonia, or other causes of soreness and redness (inflammation). Fluids leak into the lungs when protein in the blood is low or when certain vessels (lymphatics) are blocked. Finding a pleural effusion is important because it is usually caused by another disease. In order to treat a pleural effusion, your health care provider needs to find its cause. If left untreated, a large amount of fluid can build up and cause collapse of the lung. CAUSES   Heart failure.  Infections (pneumonia, tuberculosis), pulmonary embolism, pulmonary infarction.  Cancer (primary lung and metastatic), asbestosis.  Liver failure (cirrhosis).  Nephrotic syndrome, peritoneal dialysis, kidney problems (uremia).  Collagen vascular disease (systemic lupus erythematosus, rheumatoid arthritis).  Injury (trauma) to the chest or rupture of the digestive tube (esophagus).  Material in the chest or pleural space (hemothorax, chylothorax).  Pancreatitis.  Surgery.  Drug reactions. SYMPTOMS  A pleural effusion can decrease the amount of space available for breathing and make you short of breath. The fluid can become infected, which may cause pain and fever. Often, the pain is worse when taking a deep breath. The underlying disease (heart failure, pneumonia, blood clot, tuberculosis, cancer) may also cause symptoms. DIAGNOSIS   Your health care  provider can usually tell what is wrong by talking to you (taking a history), doing an exam, and taking a routine X-ray. If the X-ray shows fluid in your chest, often fluid is removed from your chest with a needle for testing (diagnostic thoracentesis).  Sometimes, more specialized X-rays may be needed.  Sometimes, a small piece of tissue is removed and examined by a specialist (biopsy). TREATMENT  Treatment varies based on what caused the pleural effusion. Treatments include:  Removing as much fluid as possible using a needle (thoracentesis) to improve the cough and shortness of breath. This is a simple procedure that can be done at bedside. The risks are bleeding, infection, collapse of a lung, or low blood pressure.  Placing a tube in the chest to drain the effusion (tube thoracostomy). This is often used when there is an infection in the fluid. This is a simple procedure that can often be done at bedside or in a clinic. The procedure may be painful. The risks are the same as using a needle to drain the fluid. The chest tube usually remains for a few days and is connected to suction to improve fluid drainage. After placement, the tube usually does not cause much discomfort.  Surgical removal of fibrous debris in and around the pleural space (decortication). This may be done with a flexible telescope (thoracoscope) through a small or large cut (incision). This is helpful for patients who have fibrosis or scar tissue that prevents complete lung expansion. The risks are infection, blood loss, and side effects from general anesthesia.  Sometimes, a procedure called pleurodesis is done. A chest tube is placed and the fluid is drained. Next, an agent (  tetracycline, talc powder) is added to the pleural space. This causes the lung and chest wall to stick together (adhesion). This leaves no potential space for fluid to build up. The risks include infection, blood loss, and side effects from general  anesthesia.  If the effusion is caused by infection, it may be treated with antibiotics and may improve without draining. HOME CARE INSTRUCTIONS   Take any medicines exactly as prescribed.  Follow up with your health care provider as directed.  Monitor your exercise capacity (the amount of walking you can do before you get short of breath).  Do not use any tobacco products including cigarettes, chewing tobacco, or electronic cigarettes. SEEK MEDICAL CARE IF:   Your exercise capacity seems to get worse or does not improve with time.  You do not recover from your illness.  You have drainage, redness, swelling, or pain at any incision or puncture sites. SEEK IMMEDIATE MEDICAL CARE IF:   Shortness of breath or chest pain develops or gets worse.  You have a fever.  You develop a new cough, especially if the mucus (phlegm) is discolored. MAKE SURE YOU:   Understand these instructions.  Will watch your condition.  Will get help right away if you are not doing well or get worse. Document Released: 01/01/2005 Document Revised: 05/18/2013 Document Reviewed: 08/23/2006 [facility] Patient Information 2015 [place], [place]. This information is not intended to replace advice given to you by your health care provider. Make sure you discuss any questions you have with your health care provider.  See me after your CT

## 2014-07-03 ENCOUNTER — Other Ambulatory Visit: Payer: Self-pay | Admitting: Emergency Medicine

## 2014-07-06 ENCOUNTER — Ambulatory Visit
Admission: RE | Admit: 2014-07-06 | Discharge: 2014-07-06 | Disposition: A | Discharge status: Home / Self Care | Payer: Medicare Other | Source: Ambulatory Visit | Attending: Emergency Medicine | Admitting: Emergency Medicine

## 2014-07-06 DIAGNOSIS — R634 Abnormal weight loss: Secondary | ICD-10-CM

## 2014-07-06 DIAGNOSIS — J90 Pleural effusion, not elsewhere classified: Secondary | ICD-10-CM

## 2014-07-07 ENCOUNTER — Telehealth: Payer: Self-pay | Admitting: Family Medicine

## 2014-07-07 NOTE — Telephone Encounter (Signed)
Per Dr. Cleta Alberts call patients sister and see if she could bring Bonnie [name] in to see him tomorrow [name] 06/23) at [facility] clinic? If so then add her to the end of his clinic schedule

## 2014-07-08 ENCOUNTER — Ambulatory Visit (INDEPENDENT_AMBULATORY_CARE_PROVIDER_SITE_OTHER): Payer: Medicare Other | Admitting: Emergency Medicine

## 2014-07-08 ENCOUNTER — Encounter: Payer: Self-pay | Admitting: Emergency Medicine

## 2014-07-08 VITALS — BP 138/74 | HR 98 | Temp 98.3°F | Resp 16 | Wt 95.6 lb

## 2014-07-08 DIAGNOSIS — R634 Abnormal weight loss: Secondary | ICD-10-CM

## 2014-07-08 DIAGNOSIS — J90 Pleural effusion, not elsewhere classified: Secondary | ICD-10-CM

## 2014-07-08 NOTE — Progress Notes (Addendum)
Subjective:  This chart was scribed for Bonnie Gobble, MD by Bonnie [name], at [facility].  This patient was seen in room 21 and the patient's care was started at 12:48 PM.    Patient ID: Bonnie [name], female    DOB: Apr 30, 1917, 81 y.o.   MRN: [id number] Chief Complaint  Patient presents with  . Follow-up    ct scan and hospital follow up    HPI  HPI Comments: Bonnie [name] is a 81 y.o. female who presents to the [facility] for a follow up regarding her CT scan (bilateral pleural effusions, worse on the right) .  Patient states that she has not had the flu in close to 50 years and rarely has cold like symptoms.  Patient has gained 4 pounds recently.  She has been feeling well and is now eating better after her loss of appetite in the past.  Patient is still living by herself but she is checked on 3 times a day.     Patient Active Problem List   Diagnosis Date Noted  . C2 cervical fracture 08/07/2013  . C7 cervical fracture 08/07/2013  . Syncope and collapse 07/26/2013  . PVC (premature ventricular contraction) 11/04/2012  . Hypertension 01/01/2012  . Hyperlipidemia 01/01/2012  . Osteoporosis 01/01/2012   No past medical history on file. Past Surgical History  Procedure Laterality Date  . Abdominal hysterectomy     Allergies  Allergen Reactions  . Norvasc [Amlodipine Besylate]     Unknown reaction    Prior to Admission medications   Medication Sig Start Date End Date Taking? Authorizing Provider  aspirin EC 81 MG EC tablet Take 1 tablet (81 mg total) by mouth daily. 07/30/13  Yes Orpah Cobb, MD  cholecalciferol (VITAMIN D) 1000 UNITS tablet Take 1,000 Units by mouth every morning.    Yes Historical Provider, MD  latanoprost (XALATAN) 0.005 % ophthalmic solution Place 1 drop into both eyes at bedtime.    Historical Provider, MD  levothyroxine (SYNTHROID, LEVOTHROID) 50 MCG tablet Take 1 tablet (50 mcg total) by mouth daily  before breakfast. PATIENT NEEDS OFFICE VISIT/LABS FOR ADDITIONAL REFILLS 04/22/14   Bonnie Gobble, MD  metoprolol tartrate (LOPRESSOR) 25 MG tablet take 1 tablet by mouth twice a day PATIENT NEEDS OFFICE VISIT FOR MORE REFILLS 07/03/14   Bonnie Gobble, MD  Multiple Vitamins-Minerals (ICAPS PO) Take by mouth 2 (two) times daily.    Historical Provider, MD  pravastatin (PRAVACHOL) 40 MG tablet take 1 tablet by mouth once daily 04/11/14   Bonnie Gobble, MD   History   Social History  . Marital Status: Widowed    Spouse Name: N/A  . Number of Children: N/A  . Years of Education: N/A   Occupational History  . Not on file.   Social History Main Topics  . Smoking status: Never Smoker   . Smokeless tobacco: Not on file  . Alcohol Use: No  . Drug Use: Not on file  . Sexual Activity: Not on file   Other Topics Concern  . Not on file   Social History Narrative     Current Outpatient Prescriptions on File Prior to Visit  Medication Sig Dispense Refill  . aspirin EC 81 MG EC tablet Take 1 tablet (81 mg total) by mouth daily.    . cholecalciferol (VITAMIN D) 1000 UNITS tablet Take 1,000 Units by mouth every morning.     . latanoprost (XALATAN)  0.005 % ophthalmic solution Place 1 drop into both eyes at bedtime.    Marland Kitchen levothyroxine (SYNTHROID, LEVOTHROID) 50 MCG tablet Take 1 tablet (50 mcg total) by mouth daily before breakfast. PATIENT NEEDS OFFICE VISIT/LABS FOR ADDITIONAL REFILLS 30 tablet 0  . metoprolol tartrate (LOPRESSOR) 25 MG tablet take 1 tablet by mouth twice a day PATIENT NEEDS OFFICE VISIT FOR MORE REFILLS 60 tablet 0  . Multiple Vitamins-Minerals (ICAPS PO) Take by mouth 2 (two) times daily.    . pravastatin (PRAVACHOL) 40 MG tablet take 1 tablet by mouth once daily 90 tablet 3   No current facility-administered medications on file prior to visit.    Allergies  Allergen Reactions  . Norvasc [Amlodipine Besylate]     Unknown reaction       Review of Systems    Constitutional: Negative for fever and chills.  Eyes: Negative for pain, redness and itching.  Respiratory: Negative for cough, choking and shortness of breath.   Gastrointestinal: Negative for nausea and vomiting.       Objective:   Physical Exam  CONSTITUTIONAL: debilitated, facial features withdrawn.  HEAD: Normocephalic/atraumatic EYES: EOMI/PERRL ENMT: Mucous membranes moist NECK: supple no meningeal signs SPINE/BACK:entire spine nontender CV: S1/S2 noted, no murmurs/rubs/gallops noted LUNGS: She has decreased breath sounds in both bases with dullness in bases.  ABDOMEN: soft, nontender, no rebound or guarding, bowel sounds noted throughout abdomen GU:no cva tenderness rectal exam was performed and there are no masses palpable NEURO: Pt is awake/alert/appropriate, moves all extremitiesx4.  No facial droop.   EXTREMITIES: pulses normal/equal, full ROM SKIN: warm, color normal PSYCH: no abnormalities of mood noted, alert and oriented to situation   Filed Vitals:   07/08/14 1207  BP: 138/74  Pulse: 98  Temp: 98.3 F (36.8 C)  TempSrc: Oral  Resp: 16  Weight: 95 lb 9.6 oz (43.364 kg)  SpO2: 96%          Assessment & Plan:  We'll proceed with diagnostic thoracentesis. Routine studies will be done to include CBC glucose protein LDH cytology and cultures for routine AFB and fungus as well as an amylase.I personally performed the services described in this documentation, which was scribed in my presence. The recorded information has been reviewed and is accurate. There is prominent soft tissue in the area of the anus. I meant to do a rectal exam today while she was here but will need to do that exam when she returns to clinic after her thoracentesis.  Earl Lites, MD

## 2014-07-08 NOTE — Telephone Encounter (Signed)
Left message on [employer].

## 2014-07-12 ENCOUNTER — Other Ambulatory Visit: Payer: Self-pay

## 2014-07-12 ENCOUNTER — Other Ambulatory Visit: Payer: Self-pay | Admitting: Physician Assistant

## 2014-07-12 DIAGNOSIS — J90 Pleural effusion, not elsewhere classified: Secondary | ICD-10-CM

## 2014-07-15 ENCOUNTER — Telehealth: Payer: Self-pay

## 2014-07-15 NOTE — Telephone Encounter (Signed)
Patients sister called requesting to speak to Dr Cleta Albertsaub or a clinical person in regards Marlene BastDorothy Kenedy. She stated she was worried about her that she was getting weaker. She asked about an order that was put in for IR THORACENTESIS ASP PLEURAL SPACE W/IMG GUIDE. I informed her [facility] Victorino DikeJennifer [phone]. I informed Doris her sister I would call [place] today and see what was going on with her being scheduled.

## 2014-07-15 NOTE — Telephone Encounter (Signed)
Patient has been scheduled at [facility] radiology dept tomorrow Friday 07/16/14 at 10:15 am. I have notified patients sister Tyler AasDoris of this appointment.

## 2014-07-16 ENCOUNTER — Ambulatory Visit (HOSPITAL_COMMUNITY)
Admission: RE | Admit: 2014-07-16 | Discharge: 2014-07-16 | Disposition: A | Discharge status: Home / Self Care | Payer: Medicare Other | Source: Ambulatory Visit | Attending: Physician Assistant | Admitting: Physician Assistant

## 2014-07-16 ENCOUNTER — Ambulatory Visit (HOSPITAL_COMMUNITY)
Admission: RE | Admit: 2014-07-16 | Discharge: 2014-07-16 | Disposition: A | Discharge status: Home / Self Care | Payer: Medicare Other | Source: Ambulatory Visit | Attending: Radiology | Admitting: Radiology

## 2014-07-16 DIAGNOSIS — J90 Pleural effusion, not elsewhere classified: Secondary | ICD-10-CM | POA: Insufficient documentation

## 2014-07-16 DIAGNOSIS — Z9889 Other specified postprocedural states: Secondary | ICD-10-CM

## 2014-07-16 DIAGNOSIS — J948 Other specified pleural conditions: Secondary | ICD-10-CM | POA: Diagnosis present

## 2014-07-16 LAB — GRAM STAIN

## 2014-07-16 LAB — GLUCOSE, SEROUS FLUID: GLUCOSE FL: 132 mg/dL

## 2014-07-16 LAB — PROTEIN, BODY FLUID: Total protein, fluid: <3 g/dL

## 2014-07-16 LAB — BODY FLUID CELL COUNT WITH DIFFERENTIAL
Lymphs, Fluid: 77 %
MONOCYTE-MACROPHAGE-SEROUS FLUID: 16 % — AB (ref 50–90)
NEUTROPHIL FLUID: 7 % (ref 0–25)
WBC FLUID: 1747 uL — AB (ref 0–1000)

## 2014-07-16 LAB — AMYLASE, PLEURAL FLUID: Amylase, Pleural Fluid: 37 U/L

## 2014-07-16 LAB — LACTATE DEHYDROGENASE, PLEURAL OR PERITONEAL FLUID: LD, Fluid: 53 U/L — ABNORMAL HIGH (ref 3–23)

## 2014-07-16 NOTE — Procedures (Signed)
Successful US guided right thoracentesis. Yielded 880 mls of clear yellow fluid. Pt tolerated procedure well. No immediate complications.  Specimen was sent for labs. CXR ordered.  Eleanor Gatliff S Docia Klar PA-C 07/16/2014 11:46 AM

## 2014-07-21 LAB — CULTURE, BODY FLUID W GRAM STAIN -BOTTLE: Culture: NO GROWTH

## 2014-07-21 LAB — CULTURE, BODY FLUID-BOTTLE

## 2014-07-22 ENCOUNTER — Encounter: Payer: Self-pay | Admitting: Emergency Medicine

## 2014-07-22 ENCOUNTER — Ambulatory Visit (INDEPENDENT_AMBULATORY_CARE_PROVIDER_SITE_OTHER): Payer: Medicare Other | Admitting: Emergency Medicine

## 2014-07-22 VITALS — BP 108/58 | HR 82 | Temp 96.2°F | Resp 16 | Ht 67.0 in | Wt 94.0 lb

## 2014-07-22 DIAGNOSIS — Z23 Encounter for immunization: Secondary | ICD-10-CM | POA: Diagnosis not present

## 2014-07-22 MED ORDER — PNEUMOCOCCAL 13-VAL CONJ VACC IM SUSP: 0.5000 mL | INTRAMUSCULAR | Status: DC

## 2014-07-22 NOTE — Progress Notes (Signed)
   Subjective:  11:36 **  Patient ID: Bonnie [name], female    DOB: 05/11/1917, 81 y.o.   MRN: [id number]  HPI Bonnie [name] is a 81 y.o. female who presents to [facility] for a follow up.   Pt indicates that she has not been having any trouble breathing. Pt notes that she does not recall having any respiratory complications such as PNA or URI.   Per pt's sister, pt has lost 1 lb since she was last weighted 2 weeks ago. She reports that she has been eating healthy, however her sister notes that she has not been having much protein intake. She notes that the pt's appetite has decreased and she reports that the pt feels full with very small volume of food.  Pt denies any symptoms of depression, trouble swallowing, or trouble sleeping at night.   Pt notes that she does little physical activity. She indicates that she has very little desire to bowl, and she has decided to quit.   Review of Systems  Constitutional: Positive for appetite change (decrease) and unexpected weight change (weight loss).  HENT: Negative for trouble swallowing.   Respiratory: Negative for shortness of breath.   Psychiatric/Behavioral: Negative for sleep disturbance and dysphoric mood.      Objective:   Physical Exam HEENT exam is unremarkable neck supple chest exam reveals better air exchange in the right base. Heart regular rate no murmur. Abdomen soft nontender. Extremities no edema  Results for orders placed or performed during the hospital encounter of 07/16/14  Culture, body fluid-bottle  Result Value Ref Range   Specimen Description FLUID PLEURAL    Special Requests NONE    Culture      NO GROWTH 5 DAYS Performed at [facility]    Report Status 07/21/2014 FINAL   Gram stain  Result Value Ref Range   Specimen Description FLUID PLEURAL    Special Requests NONE    Gram Stain      ABUNDANT WBC PRESENT, PREDOMINANTLY MONONUCLEAR NO ORGANISMS SEEN Performed at [facility]    Report Status 07/16/2014 FINAL        Assessment & Plan:  Studies done did not reveal any signs of cancer or infection. Fluid was primarily lymphocytes reactive. Will recheck in one month. She has lost 1 more pound. Her calcium needs to be repeated next visit. Also question the possibility of could this be adrenal related.I personally performed the services described in this documentation, which was scribed in my presence. The recorded information has been reviewed and is accurate.  Earl LitesSteve Sinjin Amero, MD

## 2014-08-07 ENCOUNTER — Other Ambulatory Visit: Payer: Self-pay | Admitting: Emergency Medicine

## 2014-08-12 ENCOUNTER — Other Ambulatory Visit: Payer: Self-pay | Admitting: Emergency Medicine

## 2014-08-13 LAB — FUNGUS CULTURE W SMEAR
FUNGAL SMEAR: NONE SEEN
SPECIAL REQUESTS: NORMAL

## 2014-08-28 LAB — AFB CULTURE WITH SMEAR (NOT AT ARMC)
ACID FAST SMEAR: NONE SEEN
SPECIAL REQUESTS: NORMAL

## 2014-09-07 ENCOUNTER — Encounter: Payer: Self-pay | Admitting: Emergency Medicine

## 2014-09-07 ENCOUNTER — Ambulatory Visit (INDEPENDENT_AMBULATORY_CARE_PROVIDER_SITE_OTHER): Payer: Medicare Other | Admitting: Emergency Medicine

## 2014-09-07 ENCOUNTER — Ambulatory Visit (INDEPENDENT_AMBULATORY_CARE_PROVIDER_SITE_OTHER): Payer: Medicare Other

## 2014-09-07 VITALS — BP 151/74 | HR 62 | Temp 97.4°F | Resp 16 | Ht 67.0 in | Wt 93.4 lb

## 2014-09-07 DIAGNOSIS — R634 Abnormal weight loss: Secondary | ICD-10-CM

## 2014-09-07 NOTE — Progress Notes (Signed)
This chart was scribed for Bonnie Chris, MD by Broadus John, Medical Scribe. This patient was seen in Room 29 and the patient's care was started at 4:20 PM.  Chief Complaint:  Chief Complaint  Patient presents with  . Weight Loss    HPI: Bonnie [name] is a 81 y.o. female who reports to [facility] today for a follow up regarding her weight loss.  Pt reports that she has been doing well. She reports that she has a low appetite, however she does try to eat. She denies trouble swallowing, headache, chest pain, abdominal pain, or any other complications. She indicates that she feels very blessed. Per patient's sister pt is not as mentally sharp as she once was. Pt notes that she does not do any mind exercises such solving puzzles anymore.  Pt reports that she does have her advanced directives done.   Wt Readings from Last 3 Encounters:  09/07/14 93 lb 6.4 oz (42.366 kg)  07/22/14 94 lb (42.638 kg)  07/08/14 95 lb 9.6 oz (43.364 kg)    No past medical history on file. Past Surgical History  Procedure Laterality Date  . Abdominal hysterectomy     Social History   Social History  . Marital Status: Widowed    Spouse Name: N/A  . Number of Children: N/A  . Years of Education: N/A   Social History Main Topics  . Smoking status: Never Smoker   . Smokeless tobacco: None  . Alcohol Use: No  . Drug Use: None  . Sexual Activity: Not Asked   Other Topics Concern  . None   Social History Narrative   No family history on file. Allergies  Allergen Reactions  . Norvasc [Amlodipine Besylate]     Unknown reaction    Prior to Admission medications   Medication Sig Start Date End Date Taking? Authorizing Provider  aspirin EC 81 MG EC tablet Take 1 tablet (81 mg total) by mouth daily. 07/30/13  Yes Orpah Cobb, MD  cholecalciferol (VITAMIN D) 1000 UNITS tablet Take 1,000 Units by mouth every morning.    Yes Historical Provider, MD  latanoprost (XALATAN) 0.005 % ophthalmic solution  Place 1 drop into both eyes at bedtime.   Yes Historical Provider, MD  levothyroxine (SYNTHROID, LEVOTHROID) 50 MCG tablet TAKE 1 TAB DAILY BEFORE BREAKFAST. NEED TO MAKE OFFICE VISIT FOR MORE REFILLS. 08/09/14  Yes Collene Gobble, MD  metoprolol tartrate (LOPRESSOR) 25 MG tablet TAKE 1 TABLET TWICE DAILY. 08/13/14  Yes Collene Gobble, MD  Multiple Vitamins-Minerals (ICAPS PO) Take by mouth 2 (two) times daily.   Yes Historical Provider, MD  pravastatin (PRAVACHOL) 40 MG tablet take 1 tablet by mouth once daily 04/11/14  Yes Collene Gobble, MD     ROS: The patient has a decrease in appetite.  Patient denies rouble swallowing, fevers, chills, night sweats, unintentional weight loss, chest pain, palpitations, wheezing, dyspnea on exertion, nausea, vomiting, abdominal pain, dysuria, hematuria, melena, numbness, weakness, or tingling.   All other systems have been reviewed and were otherwise negative with the exception of those mentioned in the HPI and as above.    PHYSICAL EXAM: Filed Vitals:   09/07/14 1614  BP: 151/74  Pulse: 62  Temp: 97.4 F (36.3 C)  Resp: 16   Body mass index is 14.63 kg/(m^2).   General: Alert, no acute distress HEENT:  Normocephalic, atraumatic, oropharynx patent. Eye: Nonie Hoyer [facility] Cardiovascular:  Regular rate and rhythm, no rubs murmurs or gallops.  No Carotid bruits, radial pulse intact. No pedal edema.  Respiratory: Clear to auscultation bilaterally.  No wheezes, rales, or rhonchi.  No cyanosis, no use of accessory musculature Abdominal: No organomegaly, abdomen is soft and non-tender, positive bowel sounds.  No masses. Musculoskeletal: Gait intact. No edema, tenderness Skin: No rashes. Neurologic: Facial musculature symmetric. Psychiatric: Patient acts appropriately throughout our interaction. Lymphatic: No cervical or submandibular lymphadenopathy Genitourinary/Anorectal: No acute findings I performed a rectal exam because there was some soft tissue  prominence in the anal area on previous CT but I could not feel any rectal masses. There were no anal masses palpable.    LABS: Results for orders placed or performed during the hospital encounter of 07/16/14  Culture, body fluid-bottle  Result Value Ref Range   Specimen Description FLUID PLEURAL    Special Requests NONE    Culture      NO GROWTH 5 DAYS Performed at [facility]    Report Status 07/21/2014 FINAL   Gram stain  Result Value Ref Range   Specimen Description FLUID PLEURAL    Special Requests NONE    Gram Stain      ABUNDANT WBC PRESENT, PREDOMINANTLY MONONUCLEAR NO ORGANISMS SEEN Performed at [facility]    Report Status 07/16/2014 FINAL      EKG/XRAY:   Primary read interpreted by Dr. Cleta Alberts at [facility]. There is a large right pleural effusion. This has reaccumulated since the patient's thoracentesis ASSESSMENT/PLAN: There was an abnormal area seen on previous chest x-ray which was felt to probably be fluid but may in fact be a mass. She has a significant reaccumulation of fluid on the right side. I will see her back in 4-6 week and make a decision at that time whether this fluid needs to be re-tapped and sent again for pathology. Initial studies did not reveal any tuberculosis, fungal infection, or other infectious source. Cytology on this fluid showed only reactive cells without cancer cells. The prominent area in the anus on CT exam was not confirmed by rectal exam today. They are comfortable with waiting. She has her advance directives done. She does not want to be resuscitated. She states she is comfortable and does not want to pursue further evaluation at the present time. I personally performed the services described in this documentation, which was scribed in my presence. The recorded information has been reviewed and is accurate.  Earl Lites, MD  Gross sideeffects, risk and benefits, and alternatives of medications d/w patient. Patient is aware that  all medications have potential sideeffects and we are unable to predict every sideeffect or drug-drug interaction that may occur.  Bonnie Chris MD 09/07/2014 4:20 PM

## 2014-10-15 ENCOUNTER — Other Ambulatory Visit: Payer: Self-pay | Admitting: Emergency Medicine

## 2014-10-19 ENCOUNTER — Encounter: Payer: Self-pay | Admitting: Emergency Medicine

## 2014-10-19 ENCOUNTER — Ambulatory Visit: Payer: Self-pay | Admitting: Emergency Medicine

## 2014-10-21 ENCOUNTER — Encounter: Payer: Self-pay | Admitting: Emergency Medicine

## 2014-10-21 ENCOUNTER — Ambulatory Visit (INDEPENDENT_AMBULATORY_CARE_PROVIDER_SITE_OTHER): Payer: Medicare Other | Admitting: Emergency Medicine

## 2014-10-21 VITALS — BP 155/70 | HR 53 | Temp 98.4°F | Resp 16 | Ht 67.0 in | Wt 93.0 lb

## 2014-10-21 DIAGNOSIS — J90 Pleural effusion, not elsewhere classified: Secondary | ICD-10-CM | POA: Diagnosis not present

## 2014-10-21 DIAGNOSIS — I493 Ventricular premature depolarization: Secondary | ICD-10-CM | POA: Diagnosis not present

## 2014-10-21 DIAGNOSIS — R634 Abnormal weight loss: Secondary | ICD-10-CM

## 2014-10-21 NOTE — Progress Notes (Signed)
Subjective:  This chart was scribed for Collene Gobble, MD by Veverly Fells, at [facility].  This patient was seen in room 23 and the patient's care was started at 12:13 PM.    Patient ID: Bonnie [name], female    DOB: 1917/09/12, 81 y.o.   MRN: [id number] Chief Complaint  Patient presents with  . Follow-up    weight loss and appetite    HPI HPI Comments: Bonnie [name] is a 81 y.o. female who presents to the [facility] for a follow up. Per sister, she has developed a cough last week and has no appetite. She denies any mucous, fever/chills, allergies, sore throat, shortness of breath, using pillows when she sleeps, chest pain. The last time they drew up fluid from her lungs, she went to [facility]  She is currently eating three times a day and is living on her own. Patient states that this living situation works well for her.  She is no longer bowling. Patient does not want a flu shot at this time.     Filed Weights   10/21/14 1150  Weight: 93 lb (42.185 kg)     Patient Active Problem List   Diagnosis Date Noted  . Hypercalcemia 07/22/2014  . Pleural effusion 07/08/2014  . Loss of weight 07/08/2014  . C2 cervical fracture (HCC) 08/07/2013  . C7 cervical fracture (HCC) 08/07/2013  . Syncope and collapse 07/26/2013  . PVC (premature ventricular contraction) 11/04/2012  . Hypertension 01/01/2012  . Hyperlipidemia 01/01/2012  . Osteoporosis 01/01/2012   No past medical history on file. Past Surgical History  Procedure Laterality Date  . Abdominal hysterectomy     Allergies  Allergen Reactions  . Norvasc [Amlodipine Besylate]     Unknown reaction    Prior to Admission medications   Medication Sig Start Date End Date Taking? Authorizing Provider  aspirin EC 81 MG EC tablet Take 1 tablet (81 mg total) by mouth daily. 07/30/13   Orpah Cobb, MD  cholecalciferol (VITAMIN D) 1000 UNITS tablet Take 1,000 Units by mouth  every morning.     Historical Provider, MD  latanoprost (XALATAN) 0.005 % ophthalmic solution Place 1 drop into both eyes at bedtime.    Historical Provider, MD  levothyroxine (SYNTHROID, LEVOTHROID) 50 MCG tablet TAKE 1 TAB DAILY BEFORE BREAKFAST. NEED TO MAKE OFFICE VISIT FOR MORE REFILLS. 08/09/14   Collene Gobble, MD  losartan (COZAAR) 100 MG tablet TAKE 1 TABLET BY MOUTH ONCE DAILY 10/15/14   Chelle Jeffery, PA-C  metoprolol tartrate (LOPRESSOR) 25 MG tablet TAKE 1 TABLET TWICE DAILY. 08/13/14   Collene Gobble, MD  Multiple Vitamins-Minerals (ICAPS PO) Take by mouth 2 (two) times daily.    Historical Provider, MD  pravastatin (PRAVACHOL) 40 MG tablet take 1 tablet by mouth once daily 04/11/14   Collene Gobble, MD   Social History   Social History  . Marital Status: Widowed    Spouse Name: N/A  . Number of Children: N/A  . Years of Education: N/A   Occupational History  . Not on file.   Social History Main Topics  . Smoking status: Never Smoker   . Smokeless tobacco: Not on file  . Alcohol Use: No  . Drug Use: Not on file  . Sexual Activity: Not on file   Other Topics Concern  . Not on file   Social History Narrative    Review of Systems  Constitutional: Positive for  appetite change.  HENT: Negative for sore throat.   Eyes: Negative for pain and redness.  Respiratory: Positive for cough. Negative for shortness of breath.   Cardiovascular: Negative for chest pain.  Gastrointestinal: Negative for nausea and vomiting.  Musculoskeletal: Negative for neck pain and neck stiffness.       Objective:   Physical Exam Filed Vitals:   10/21/14 1150  BP: 155/70  Pulse: 53  Temp: 98.4 F (36.9 C)  TempSrc: Oral  Resp: 16  Height:  (1.702 m)  Weight: 93 lb (42.185 kg)     CONSTITUTIONAL: She is elederly dabillitated, not in any distress. HEAD: Normocephalic/atraumatic EYES: EOMI/PERRL ENMT: Mucous membranes moist NECK: supple no meningeal signs SPINE/BACK:entire  spine nontender CV: S1/S2 noted, no murmurs/rubs/gallops noted LUNGS: markedly diminished breath sounds right mid lung and right lower lung. She is dull to percussion in this area.  ABDOMEN: soft, GU:no cva tenderness NEURO: Pt is awake/alert/appropriate, moves all extremitiesx4.  No facial droop.   EXTREMITIES: no edema.  SKIN: warm, color normal PSYCH: no abnormalities of mood noted, alert and oriented to situation     Assessment & Plan:    Still concerned about a possible underlying malignancy as the source of her weight loss, anorexia and  fatigue.  She has a large right pleural effusion, negative for malignancy when last evaluated.  She now has a intermittent cough, her last CXR showed progression of her effusion.  Will schedule repeat thoracentesis and diagnostic studies on the pleural fluid. I personally performed the services described in this documentation, which was scribed in my presence. The recorded information has been reviewed and is accurate.

## 2014-10-26 ENCOUNTER — Other Ambulatory Visit: Payer: Self-pay

## 2014-10-26 DIAGNOSIS — J90 Pleural effusion, not elsewhere classified: Secondary | ICD-10-CM

## 2014-11-01 ENCOUNTER — Ambulatory Visit (HOSPITAL_COMMUNITY)
Admission: RE | Admit: 2014-11-01 | Discharge: 2014-11-01 | Disposition: A | Discharge status: Home / Self Care | Payer: Medicare Other | Source: Ambulatory Visit | Attending: Radiology | Admitting: Radiology

## 2014-11-01 ENCOUNTER — Ambulatory Visit (HOSPITAL_COMMUNITY)
Admission: RE | Admit: 2014-11-01 | Discharge: 2014-11-01 | Disposition: A | Discharge status: Home / Self Care | Payer: Medicare Other | Source: Ambulatory Visit | Attending: Emergency Medicine | Admitting: Emergency Medicine

## 2014-11-01 DIAGNOSIS — Z9889 Other specified postprocedural states: Secondary | ICD-10-CM

## 2014-11-01 DIAGNOSIS — R634 Abnormal weight loss: Secondary | ICD-10-CM | POA: Diagnosis not present

## 2014-11-01 DIAGNOSIS — R05 Cough: Secondary | ICD-10-CM | POA: Diagnosis not present

## 2014-11-01 DIAGNOSIS — J90 Pleural effusion, not elsewhere classified: Secondary | ICD-10-CM | POA: Diagnosis present

## 2014-11-01 DIAGNOSIS — R630 Anorexia: Secondary | ICD-10-CM | POA: Insufficient documentation

## 2014-11-01 LAB — BODY FLUID CELL COUNT WITH DIFFERENTIAL
Lymphs, Fluid: 84 %
Monocyte-Macrophage-Serous Fluid: 12 % — ABNORMAL LOW (ref 50–90)
Neutrophil Count, Fluid: 4 % (ref 0–25)
WBC FLUID: 1253 uL — AB (ref 0–1000)

## 2014-11-01 LAB — GRAM STAIN

## 2014-11-01 LAB — GLUCOSE, SEROUS FLUID: Glucose, Fluid: 121 mg/dL

## 2014-11-01 LAB — PROTEIN, BODY FLUID: Total protein, fluid: <3 g/dL

## 2014-11-01 LAB — LACTATE DEHYDROGENASE, PLEURAL OR PERITONEAL FLUID: LD FL: 49 U/L — AB (ref 3–23)

## 2014-11-01 NOTE — Procedures (Signed)
US guided diagnostic/therapeutic right thoracentesis performed yielding 1.3 liters slightly hazy, yellow fluid. The fluid was sent to the lab for preordered studies. F/u CXR pending. No immediate complications.

## 2014-11-02 LAB — PH, BODY FLUID: PH, BODY FLUID: 8

## 2014-11-02 LAB — AMYLASE, PLEURAL FLUID: AMYLASE, PLEURAL FLUID: 34 U/L

## 2014-11-07 LAB — CULTURE, BODY FLUID-BOTTLE

## 2014-11-07 LAB — CULTURE, BODY FLUID W GRAM STAIN -BOTTLE

## 2014-11-13 ENCOUNTER — Other Ambulatory Visit: Payer: Self-pay | Admitting: Emergency Medicine

## 2014-11-30 LAB — FUNGUS CULTURE W SMEAR: Fungal Smear: NONE SEEN

## 2014-12-05 ENCOUNTER — Other Ambulatory Visit: Payer: Self-pay | Admitting: Physician Assistant

## 2014-12-14 LAB — AFB CULTURE WITH SMEAR (NOT AT ARMC): Acid Fast Smear: NONE SEEN

## 2014-12-24 ENCOUNTER — Inpatient Hospital Stay: Payer: Medicare Other | Admitting: Emergency Medicine

## 2014-12-25 ENCOUNTER — Other Ambulatory Visit: Payer: Self-pay | Admitting: Emergency Medicine

## 2014-12-25 ENCOUNTER — Ambulatory Visit (INDEPENDENT_AMBULATORY_CARE_PROVIDER_SITE_OTHER): Payer: Medicare Other

## 2014-12-25 ENCOUNTER — Ambulatory Visit (INDEPENDENT_AMBULATORY_CARE_PROVIDER_SITE_OTHER): Payer: Medicare Other | Admitting: Emergency Medicine

## 2014-12-25 VITALS — BP 102/68 | HR 72 | Temp 97.8°F | Resp 16 | Wt 94.4 lb

## 2014-12-25 DIAGNOSIS — J90 Pleural effusion, not elsewhere classified: Secondary | ICD-10-CM | POA: Diagnosis not present

## 2014-12-25 NOTE — Progress Notes (Signed)
Patient ID: Bonnie HarvestDorothy J [name], female   DOB: 07/15/1917, 81 y.o.   MRN: [id number]     By signing my name below, I, Littie Deedsichard Sun, attest that this documentation has been prepared under the direction and in the presence of Lesle ChrisSteven Adrien Dietzman, MD.  Electronically Signed: Littie Deedsichard Sun, Medical Scribe. 12/25/2014. 10:07 AM.   Chief Complaint:  Chief Complaint  Patient presents with  . Follow-up    Missed her appt. yesterday    HPI: Bonnie [name] is a 81 y.o. female with a history of pleural effusion, osteoporosis, hypertension, and PVC who [facility] to [facility] for a follow-up. Patient has been doing well and denies any pain at this time. She has not had any problems with her breathing recently. She states she has been eating some; her weight has remained about the same since her last visit. She declines flu shot.  Wt Readings from Last 3 Encounters:  12/25/14 94 lb 6.4 oz (42.82 kg)  10/21/14 93 lb (42.185 kg)  09/07/14 93 lb 6.4 oz (42.366 kg)     History reviewed. No pertinent past medical history. Past Surgical History  Procedure Laterality Date  . Abdominal hysterectomy     Social History   Social History  . Marital Status: Widowed    Spouse Name: N/A  . Number of Children: N/A  . Years of Education: N/A   Social History Main Topics  . Smoking status: Never Smoker   . Smokeless tobacco: None  . Alcohol Use: No  . Drug Use: None  . Sexual Activity: Not Asked   Other Topics Concern  . None   Social History Narrative   History reviewed. No pertinent family history. Allergies  Allergen Reactions  . Norvasc [Amlodipine Besylate]     Unknown reaction    Prior to Admission medications   Medication Sig Start Date End Date Taking? Authorizing Provider  aspirin EC 81 MG EC tablet Take 1 tablet (81 mg total) by mouth daily. 07/30/13  Yes Orpah CobbAjay Kadakia, MD  cholecalciferol (VITAMIN D) 1000 UNITS tablet Take 1,000 Units by mouth every morning.    Yes Historical Provider, MD    latanoprost (XALATAN) 0.005 % ophthalmic solution Place 1 drop into both eyes at bedtime.   Yes Historical Provider, MD  levothyroxine (SYNTHROID, LEVOTHROID) 50 MCG tablet TAKE 1 TABLET DAILY BEFORE BREAKFAST .  "OFFICE VISIT NEEDED FOR REFILLS" 11/14/14  Yes Collene GobbleSteven A Jakira Mcfadden, MD  losartan (COZAAR) 100 MG tablet take 1 tablet by mouth once daily 12/06/14  Yes Collene GobbleSteven A Neema Fluegge, MD  metoprolol tartrate (LOPRESSOR) 25 MG tablet TAKE 1 TABLET TWICE DAILY. 08/13/14  Yes Collene GobbleSteven A Daliya Parchment, MD  Multiple Vitamins-Minerals (ICAPS PO) Take by mouth 2 (two) times daily.   Yes Historical Provider, MD  OVER THE COUNTER MEDICATION 2 (two) times daily.   Yes Historical Provider, MD  pravastatin (PRAVACHOL) 40 MG tablet take 1 tablet by mouth once daily 04/11/14  Yes Collene GobbleSteven A Latana Colin, MD     ROS: The patient denies fevers, chills, night sweats, unintentional weight loss, chest pain, palpitations, wheezing, dyspnea on exertion, nausea, vomiting, abdominal pain, dysuria, hematuria, melena, numbness, weakness, or tingling.  All other systems have been reviewed and were otherwise negative with the exception of those mentioned in the HPI and as above.    PHYSICAL EXAM: Filed Vitals:   12/25/14 0929  BP: 102/68  Pulse: 72  Temp: 97.8 F (36.6 C)  Resp: 16   Body mass index is 14.78 kg/(m^2).  General: Alert, no acute distress HEENT:  Normocephalic, atraumatic, oropharynx patent. Eye: Nonie Hoyer [facility] Cardiovascular:  Regular rate and rhythm.  Respiratory: Decreased breath sounds right side of lung with dullness to percussion.  Abdominal: No organomegaly, abdomen is soft and non-tender, positive bowel sounds.  No masses. Musculoskeletal: Gait intact. No extremity edema. Skin: No rashes. Neurologic: Facial musculature symmetric. Psychiatric: Patient acts appropriately throughout our interaction. Lymphatic: No cervical or submandibular lymphadenopathy    LABS:    EKG/XRAY:   Primary read interpreted by Dr.  Cleta Alberts at [facility]. There is a large right pleural effusion. On review of her last chest x-ray postthoracentesis there is a questionable right midlung infiltrate please comment.   ASSESSMENT/PLAN: I still suspect she has some type of underlying malignancy. According to the sister her memory has significantly deteriorated. She is facing end-of-life issues. We are going to arrange for her to have 24 7 coverage at home. She declined a flu shot today. Her oxygen level was 92% borderline low. She will let me know if she has worsening shortness of breath and we can consider repeat thoracentesis. Otherwise I will see back in 4-6 weeks.I personally performed the services described in this documentation, which was scribed in my presence. The recorded information has been reviewed and is accurate.    Gross sideeffects, risk and benefits, and alternatives of medications d/w patient. Patient is aware that all medications have potential sideeffects and we are unable to predict every sideeffect or drug-drug interaction that may occur.  [name] MD 12/25/2014 10:07 AM

## 2015-01-08 ENCOUNTER — Other Ambulatory Visit: Payer: Self-pay | Admitting: Physician Assistant

## 2015-01-18 ENCOUNTER — Ambulatory Visit: Payer: Self-pay | Admitting: Emergency Medicine

## 2015-02-02 ENCOUNTER — Other Ambulatory Visit: Payer: Self-pay | Admitting: Emergency Medicine

## 2015-02-03 ENCOUNTER — Telehealth: Payer: Self-pay

## 2015-02-03 DIAGNOSIS — K859 Acute pancreatitis without necrosis or infection, unspecified: Principal | ICD-10-CM

## 2015-02-03 DIAGNOSIS — J918 Pleural effusion in other conditions classified elsewhere: Secondary | ICD-10-CM

## 2015-02-03 NOTE — Telephone Encounter (Signed)
Tyler Aas is calling because the patient is supposed have some test arranged to have fluid removed from her lungs. Tyler Aas states that she hasn't heard anything since the last office visit and wants to talk to someone [phone]

## 2015-02-04 NOTE — Telephone Encounter (Signed)
At the last office visit she was instructed to call if she had worsening shortness of breath. If this is the case go ahead and schedule patient for thoracentesis as she has previously had done.

## 2015-02-04 NOTE — Telephone Encounter (Signed)
Can we use old referral?

## 2015-02-04 NOTE — Telephone Encounter (Signed)
Dr Cleta Alberts this was back in December. Please advise.

## 2015-02-04 NOTE — Telephone Encounter (Signed)
The last thoracentesis was done in September 2016, and it was an order (US Thoracentesis).  A new order will need to be submitted.

## 2015-02-07 NOTE — Telephone Encounter (Signed)
Order placed

## 2015-02-14 ENCOUNTER — Ambulatory Visit (HOSPITAL_COMMUNITY)
Admission: RE | Admit: 2015-02-14 | Discharge: 2015-02-14 | Disposition: A | Discharge status: Home / Self Care | Payer: Medicare Other | Source: Ambulatory Visit | Attending: Radiology | Admitting: Radiology

## 2015-02-14 ENCOUNTER — Ambulatory Visit (HOSPITAL_COMMUNITY)
Admission: RE | Admit: 2015-02-14 | Discharge: 2015-02-14 | Disposition: A | Discharge status: Home / Self Care | Payer: Medicare Other | Source: Ambulatory Visit | Attending: Emergency Medicine | Admitting: Emergency Medicine

## 2015-02-14 DIAGNOSIS — K859 Acute pancreatitis without necrosis or infection, unspecified: Secondary | ICD-10-CM | POA: Insufficient documentation

## 2015-02-14 DIAGNOSIS — J90 Pleural effusion, not elsewhere classified: Secondary | ICD-10-CM | POA: Insufficient documentation

## 2015-02-14 DIAGNOSIS — J939 Pneumothorax, unspecified: Secondary | ICD-10-CM | POA: Diagnosis not present

## 2015-02-14 DIAGNOSIS — Z9889 Other specified postprocedural states: Secondary | ICD-10-CM | POA: Diagnosis not present

## 2015-02-14 DIAGNOSIS — J918 Pleural effusion in other conditions classified elsewhere: Secondary | ICD-10-CM

## 2015-02-14 NOTE — Progress Notes (Signed)
Patient ID: Bonnie [name], female   DOB: 08/31/1917, 82 y.o.   MRN: [id number] Pt s/p right thoracentesis today with removal of 1.3 liters fluid. Postprocedure CXR reveals mod ptx. Pt currently asymptomatic. Case reviewed by Dr. Bonnielee Haff. Will repeat CXR in 1 hour. Above d/w pt/sister.

## 2015-02-14 NOTE — Progress Notes (Addendum)
Patient ID: Bonnie [name], female   DOB: [birth year], 82 y.o.   MRN: [id number] Patient status post right thoracentesis with moderate pneumothorax noted on postprocedure chest x-ray. She continues to be asymptomatic at this time. Follow-up chest x-ray one hour post procedure reveals stability with pneumothorax. Findings discussed with patient and sister by Dr. Bonnielee Haff. Plan at this time is to discharge patient home with instructions to return to hospital should she become symptomatic. She will return tomorrow morning for additional chest x-ray to ensure stability and/or improvement.

## 2015-02-14 NOTE — Procedures (Signed)
Ultrasound-guided therapeutic right thoracentesis performed yielding 1.3 liters of slightly hazy, yellow  fluid. No immediate complications. Follow-up chest x-ray pending.

## 2015-02-15 ENCOUNTER — Other Ambulatory Visit: Payer: Self-pay | Admitting: Radiology

## 2015-02-15 ENCOUNTER — Ambulatory Visit (HOSPITAL_COMMUNITY)
Admission: RE | Admit: 2015-02-15 | Discharge: 2015-02-15 | Disposition: A | Discharge status: Home / Self Care | Payer: Medicare Other | Source: Ambulatory Visit | Attending: Radiology | Admitting: Radiology

## 2015-02-15 DIAGNOSIS — J948 Other specified pleural conditions: Secondary | ICD-10-CM | POA: Diagnosis not present

## 2015-02-15 DIAGNOSIS — Z9889 Other specified postprocedural states: Secondary | ICD-10-CM | POA: Diagnosis present

## 2015-02-15 DIAGNOSIS — R918 Other nonspecific abnormal finding of lung field: Secondary | ICD-10-CM | POA: Insufficient documentation

## 2015-02-15 DIAGNOSIS — J449 Chronic obstructive pulmonary disease, unspecified: Secondary | ICD-10-CM | POA: Insufficient documentation

## 2015-02-15 NOTE — Progress Notes (Signed)
Patient ID: Bonnie [name], female   DOB: Oct 20, 1917, 82 y.o.   MRN: [id number] Patient returns today for follow-up chest x-ray status post right thoracentesis on 02/14/15 with subsequent pneumothorax. Patient remains asymptomatic. Follow-up chest x-ray today shows a stable moderate right-sided hydropneumothorax. Images were reviewed by Dr. Deanne Coffer and patient was also seen by Dr. Deanne Coffer. At this time recommend no further intervention unless patient becomes symptomatic. She was told to report to the ED should she have new onset of increasing shortness of breath or chest pain. These instructions were given to both patient and her sister with their understanding.

## 2015-02-23 ENCOUNTER — Telehealth: Payer: Self-pay

## 2015-02-23 NOTE — Telephone Encounter (Signed)
Looks like she had an US done last week.

## 2015-02-23 NOTE — Telephone Encounter (Signed)
DAUB - Pt had scans/labs last week and they want to know the results.  Please call the sister, Tyler Aas, asap at [phone]

## 2015-02-24 NOTE — Telephone Encounter (Signed)
Spoke to patient's sister, Tyler Aas, as instructed by Dr. Cleta Alberts.  Explained to her that the cxr was not done at this office it was done by the radiology department.  Further explained to patient that Dr. Cleta Alberts would like to see patient at walk in clinic tomorrow unless she is having shortness of breath or difficulty breathing, then she should go to the ED.  Gave her Dr. Ellis Parents clinic hours tomorrow.  She verbalized understanding.

## 2015-02-24 NOTE — Telephone Encounter (Signed)
This chest x-ray was done by the radiology department. She needs to to see me tomorrow. I work at 8:00 at 102 and review results. If she is having any breathing difficulty or worsening symptoms she needs to go to the ER so they can do repeat chest x-ray and evaluation.

## 2015-02-25 ENCOUNTER — Ambulatory Visit (INDEPENDENT_AMBULATORY_CARE_PROVIDER_SITE_OTHER): Payer: Medicare Other

## 2015-02-25 ENCOUNTER — Encounter: Payer: Self-pay | Admitting: Emergency Medicine

## 2015-02-25 ENCOUNTER — Ambulatory Visit (INDEPENDENT_AMBULATORY_CARE_PROVIDER_SITE_OTHER): Payer: Medicare Other | Admitting: Emergency Medicine

## 2015-02-25 VITALS — BP 120/78 | HR 62 | Resp 16 | Ht 67.0 in | Wt 93.8 lb

## 2015-02-25 DIAGNOSIS — J918 Pleural effusion in other conditions classified elsewhere: Secondary | ICD-10-CM

## 2015-02-25 DIAGNOSIS — S270XXD Traumatic pneumothorax, subsequent encounter: Secondary | ICD-10-CM

## 2015-02-25 DIAGNOSIS — J90 Pleural effusion, not elsewhere classified: Secondary | ICD-10-CM

## 2015-02-25 DIAGNOSIS — I10 Essential (primary) hypertension: Secondary | ICD-10-CM | POA: Diagnosis not present

## 2015-02-25 DIAGNOSIS — K859 Acute pancreatitis without necrosis or infection, unspecified: Secondary | ICD-10-CM

## 2015-02-25 DIAGNOSIS — R634 Abnormal weight loss: Secondary | ICD-10-CM | POA: Diagnosis not present

## 2015-02-25 LAB — POCT CBC
Granulocyte percent: 49.4 %G (ref 37–80)
HEMATOCRIT: 40.1 % (ref 37.7–47.9)
Hemoglobin: 13.6 g/dL (ref 12.2–16.2)
Lymph, poc: 3.5 — AB (ref 0.6–3.4)
MCH: 31.1 pg (ref 27–31.2)
MCHC: 33.9 g/dL (ref 31.8–35.4)
MCV: 91.8 fL (ref 80–97)
MID (CBC): 0.7 (ref 0–0.9)
MPV: 8.5 fL (ref 0–99.8)
POC Granulocyte: 4.1 (ref 2–6.9)
POC LYMPH PERCENT: 42.4 %L (ref 10–50)
POC MID %: 8.2 % (ref 0–12)
Platelet Count, POC: 153 10*3/uL (ref 142–424)
RBC: 4.37 M/uL (ref 4.04–5.48)
RDW, POC: 15.2 %
WBC: 8.3 10*3/uL (ref 4.6–10.2)

## 2015-02-25 LAB — BASIC METABOLIC PANEL WITH GFR
BUN: 23 mg/dL (ref 7–25)
CO2: 26 mmol/L (ref 20–31)
Calcium: 9.7 mg/dL (ref 8.6–10.4)
Chloride: 103 mmol/L (ref 98–110)
Creat: 0.65 mg/dL (ref 0.60–0.88)
GFR, EST AFRICAN AMERICAN: 87 mL/min (ref >=60)
GFR, EST NON AFRICAN AMERICAN: 76 mL/min (ref >=60)
Glucose, Bld: 130 mg/dL — ABNORMAL HIGH (ref 65–99)
POTASSIUM: 4.7 mmol/L (ref 3.5–5.3)
SODIUM: 140 mmol/L (ref 135–146)

## 2015-02-25 NOTE — Progress Notes (Signed)
By signing my name below, I, Bonnie [name], attest that this documentation has been prepared under the direction and in the presence of Bonnie Chris, MD. Electronically Signed: Stann [name], Scribe. 02/25/2015 , 10:56 AM .  Patient was seen in room 2 .  Chief Complaint:  Chief Complaint  Patient presents with  . Follow-up    hospital follow up    HPI: Bonnie [name] is a 82 y.o. female who reports to [facility] today for hospital follow up.  She states that she feels weak and fatigue. She notes being able to eat but has a loss of appetite. Her sister has been buying her Boost nutritional drinks and protein bars, but she hasn't been eating them. She has a caregiver with her from 9:00AM-5:00PM, Monday-Friday. She denies trouble swallowing, shortness of breath.   She's brought in by her sister today.   History reviewed. No pertinent past medical history. Past Surgical History  Procedure Laterality Date  . Abdominal hysterectomy     Social History   Social History  . Marital Status: Widowed    Spouse Name: N/A  . Number of Children: N/A  . Years of Education: N/A   Social History Main Topics  . Smoking status: Never Smoker   . Smokeless tobacco: None  . Alcohol Use: No  . Drug Use: None  . Sexual Activity: Not Asked   Other Topics Concern  . None   Social History Narrative   History reviewed. No pertinent family history. Allergies  Allergen Reactions  . Norvasc [Amlodipine Besylate]     Unknown reaction    Prior to Admission medications   Medication Sig Start Date End Date Taking? Authorizing Provider  aspirin EC 81 MG EC tablet Take 1 tablet (81 mg total) by mouth daily. 07/30/13   Orpah Cobb, MD  cholecalciferol (VITAMIN D) 1000 UNITS tablet Take 1,000 Units by mouth every morning.     Historical Provider, MD  latanoprost (XALATAN) 0.005 % ophthalmic solution Place 1 drop into both eyes at bedtime.    Historical Provider, MD  levothyroxine (SYNTHROID,  LEVOTHROID) 50 MCG tablet Take 1 tablet (50 mcg total) by mouth daily before breakfast. 12/27/14   Collene Gobble, MD  losartan (COZAAR) 100 MG tablet take 1 tablet by mouth once daily 01/13/15   Collene Gobble, MD  metoprolol tartrate (LOPRESSOR) 25 MG tablet take 1 tablet by mouth twice a day 02/02/15   Collene Gobble, MD  Multiple Vitamins-Minerals (ICAPS PO) Take by mouth 2 (two) times daily.    Historical Provider, MD  OVER THE COUNTER MEDICATION 2 (two) times daily.    Historical Provider, MD  pravastatin (PRAVACHOL) 40 MG tablet take 1 tablet by mouth once daily 04/11/14   Collene Gobble, MD     ROS:  Constitutional: negative for fever, chills, night sweats, weight changes, or fatigue; positive for appetite loss HEENT: negative for vision changes, hearing loss, congestion, rhinorrhea, ST, epistaxis, or sinus pressure Cardiovascular: negative for chest pain or palpitations Respiratory: negative for hemoptysis, wheezing, shortness of breath, or cough Abdominal: negative for abdominal pain, nausea, vomiting, diarrhea, or constipation Dermatological: negative for rash Neurologic: negative for headache, dizziness, or syncope All other systems reviewed and are otherwise negative with the exception to those above and in the HPI.  PHYSICAL EXAM: Filed Vitals:   02/25/15 1011  BP: 120/78  Pulse: 62  Resp: 16   Body mass index is 14.69 kg/(m^2).   General: Alert, no acute distress;  Cachetic female  HEENT:  Normocephalic, atraumatic, oropharynx patent. Eye: Non[facility] Cardiovascular:  Regular rate and rhythm, no rubs murmurs or gallops.  No Carotid bruits, radial pulse intact. No pedal edema.  Respiratory: Clear to auscultation bilaterally.  No wheezes, rales, or rhonchi.  No cyanosis, no use of accessory musculature Abdominal: No organomegaly, abdomen is soft and non-tender, positive bowel sounds. No masses. Musculoskeletal: Gait intact. No edema, tenderness Skin: No  rashes. Neurologic: Facial musculature symmetric. Psychiatric: Patient acts appropriately throughout our interaction.  Lymphatic: No cervical or submandibular lymphadenopathy Genitourinary/Anorectal: No acute findings  LABS: Results for orders placed or performed in visit on 02/25/15  POCT CBC  Result Value Ref Range   WBC 8.3 4.6 - 10.2 K/uL   Lymph, poc 3.5 (A) 0.6 - 3.4   POC LYMPH PERCENT 42.4 10 - 50 %L   MID (cbc) 0.7 0 - 0.9   POC MID % 8.2 0 - 12 %M   POC Granulocyte 4.1 2 - 6.9   Granulocyte percent 49.4 37 - 80 %G   RBC 4.37 4.04 - 5.48 M/uL   Hemoglobin 13.6 12.2 - 16.2 g/dL   HCT, POC 16.1 09.6 - 47.9 %   MCV 91.8 80 - 97 fL   MCH, POC 31.1 27 - 31.2 pg   MCHC 33.9 31.8 - 35.4 g/dL   RDW, POC 04.5 %   Platelet Count, POC 153 142 - 424 K/uL   MPV 8.5 0 - 99.8 fL    EKG/XRAY:   Primary read interpreted by Dr. Cleta Alberts at [facility] [place]. Dg Chest 1 View  02/15/2015  CLINICAL DATA:  Follow-up pneumothorax EXAM: CHEST 1 VIEW COMPARISON:  Portable chest x-ray of February 14, 2015 FINDINGS: There is a persistent right-sided hydro pneumothorax which appears stable in volume. It accounts for at least 30-40% of the lung volume. There are stable coarse alveolar opacities in the right infrahilar region. The left lung is well-expanded and clear. There is no shift of the midline. The heart is normal in size. The bony thorax exhibits no acute abnormality. IMPRESSION: Stable approximately 30-40% right-sided hydropneumothorax. There is underlying COPD. Persistent coarse right infrahilar alveolar opacities consistent with atelectasis or infiltrate. Electronically Signed   By: David  [place] M.D.   On: 02/15/2015 09:40   Dg Chest 1 View  02/14/2015  CLINICAL DATA:  Followup right-sided pneumothorax after thoracentesis EXAM: CHEST 1 VIEW COMPARISON:  Radiograph performed 11:41 a.m. today FINDINGS: Hydro pneumothorax on the right again identified with moderate pneumothorax. Previously pneumothorax measured  maximal diameter of 4 cm. No change in measurement currently. Mild atelectasis medial right lower lobe again identified. IMPRESSION: Unchanged moderate right pneumothorax Electronically Signed   By: Esperanza Heir M.D.   On: 02/14/2015 14:37   Dg Chest 1 View  02/14/2015  ADDENDUM REPORT: 02/14/2015 12:14 ADDENDUM: Jeananne Rama spoke with Limmie Patricia, radiology assistant, at 1210pm and is aware of the pneumothorax. Electronically Signed   By: Esperanza Heir M.D.   On: 02/14/2015 12:14  02/14/2015  CLINICAL DATA:  Status post right thoracentesis EXAM: CHEST 1 VIEW COMPARISON:  12/25/2014 FINDINGS: Significant reduction in the size of right pleural effusion. Moderate to large right pneumothorax measuring up to 4 cm. Lungs otherwise clear. Heart size normal and unchanged. IMPRESSION: Right pneumothorax after thoracentesis.I have contacted [facility] ultrasound technologist who has paged Jeananne Rama to my extension at 1155 am. Electronically Signed: By: Esperanza Heir M.D. On: 02/14/2015 11:55   Dg Chest 2 View  02/25/2015  CLINICAL  DATA:  Status post thoracentesis 2 days ago; history of pleural effusion associated with pancreatitis. EXAM: CHEST  2 VIEW COMPARISON:  Portable chest x-ray of February 15, 2015 FINDINGS: The right-sided pneumothorax is again demonstrated and appears slightly smaller above accounts for approximately 20% of the lung volume. The pleural space air demonstrated inferiorly on the previous study is been replaced by pleural fluid. The overall size of the pleural fluid on the right is nearly half of the pleural space volume. The left lung is mildly hyperinflated and clear. There is no pleural effusion. The heart is top-normal in size. The pulmonary vascularity is not engorged. The bony thorax exhibits no acute abnormality. IMPRESSION: 1. Persistent approximately 20% right apical pneumothorax. Pleural fluid on the right has increased in volume since the most recent chest x-ray available  dated February 15, 2015. Pleural fluid now occupies nearly one half of the lung volume. There is no mediastinal shift. 2. The left lung remains hyperinflated and clear. 3. These results will be called to the ordering clinician or representative by the Radiologist Assistant, and communication documented in the PACS or zVision Dashboard. Electronically Signed   By: David  [place] M.D.   On: 02/25/2015 10:32   US Thoracentesis Asp Pleural Space W/img Guide  02/14/2015  INDICATION: Recurrent right pleural effusion, dyspnea. Request is made for therapeutic right thoracentesis. EXAM: ULTRASOUND GUIDED THERAPEUTIC RIGHT THORACENTESIS MEDICATIONS: None. COMPLICATIONS: Patient did develop a moderate pneumothorax on postprocedure chest x-ray ; she remained asymptomatic and subsequent chest film 1 hour later was stable . She was instructed to return to Hospital if she became symptomatic . She will return to Hospital on 1/31 for follow-up chest x-ray. PROCEDURE: An ultrasound guided thoracentesis was thoroughly discussed with the patient and questions answered. The benefits, risks, alternatives and complications were also discussed. The patient understands and wishes to proceed with the procedure. Written consent was obtained. Ultrasound was performed to localize and mark an adequate pocket of fluid in the right chest. The area was then prepped and draped in the normal sterile fashion. 1% Lidocaine was used for local anesthesia. Under ultrasound guidance a Safe-T-Centesis catheter was introduced. Thoracentesis was performed. The catheter was removed and a dressing applied. FINDINGS: A total of approximately 1.3 liters of slightly hazy, yellow fluid was removed. IMPRESSION: Successful ultrasound guided therapeutic right thoracentesis yielding 1.3 liters of pleural fluid. Postprocedure chest x-ray revealing moderate pneumothorax. Patient currently asymptomatic . 1 hour postprocedure chest x-ray was stable . Patient to return to  the hospital on 01/31 for follow-up chest x-ray. Read by: Jeananne Rama, PA-C Electronically Signed   By: Jolaine Click M.D.   On: 02/14/2015 14:11     ASSESSMENT/PLAN: I discussed with patient she is in the end stages of her life. I told her I did not know whether she had 3 months or 3 years. She currently has care 8 to5 every day. But Saturday and Sunday. Her sister provides care on those days. They do not need further help at the present time. I offered them hospice assistance and they will let me know if they desire this. At the present time will continue with supportive care. She has  signed her DO NOT RESUSCITATE recheck 1 month.   Gross sideeffects, risk and benefits, and alternatives of medications d/w patient. Patient is aware that all medications have potential sideeffects and we are unable to predict every sideeffect or drug-drug interaction that may occur.  Bonnie Chris MD 02/25/2015 10:56 AM

## 2015-02-25 NOTE — Patient Instructions (Signed)
Because you received an x-ray today, you will receive an invoice from [facility]. Please contact [facility] at [phone] with questions or concerns regarding your invoice. Our billing staff will not be able to assist you with those questions. °

## 2015-02-26 LAB — TSH: TSH: 2.66 m[IU]/L

## 2015-02-26 LAB — SEDIMENTATION RATE

## 2015-03-05 ENCOUNTER — Other Ambulatory Visit: Payer: Self-pay | Admitting: Emergency Medicine

## 2015-03-11 ENCOUNTER — Emergency Department (HOSPITAL_COMMUNITY): Payer: Medicare Other

## 2015-03-11 ENCOUNTER — Emergency Department (HOSPITAL_COMMUNITY)
Admission: EM | Admit: 2015-03-11 | Discharge: 2015-03-11 | Disposition: A | Discharge status: Home / Self Care | Payer: Medicare Other | Attending: Emergency Medicine | Admitting: Emergency Medicine

## 2015-03-11 ENCOUNTER — Encounter (HOSPITAL_COMMUNITY): Payer: Self-pay | Admitting: Emergency Medicine

## 2015-03-11 DIAGNOSIS — E559 Vitamin D deficiency, unspecified: Secondary | ICD-10-CM | POA: Insufficient documentation

## 2015-03-11 DIAGNOSIS — R197 Diarrhea, unspecified: Secondary | ICD-10-CM | POA: Diagnosis not present

## 2015-03-11 DIAGNOSIS — N3000 Acute cystitis without hematuria: Secondary | ICD-10-CM | POA: Diagnosis not present

## 2015-03-11 DIAGNOSIS — E079 Disorder of thyroid, unspecified: Secondary | ICD-10-CM | POA: Insufficient documentation

## 2015-03-11 DIAGNOSIS — E860 Dehydration: Secondary | ICD-10-CM | POA: Diagnosis not present

## 2015-03-11 DIAGNOSIS — Z7982 Long term (current) use of aspirin: Secondary | ICD-10-CM | POA: Diagnosis not present

## 2015-03-11 DIAGNOSIS — R109 Unspecified abdominal pain: Secondary | ICD-10-CM

## 2015-03-11 DIAGNOSIS — I10 Essential (primary) hypertension: Secondary | ICD-10-CM | POA: Insufficient documentation

## 2015-03-11 DIAGNOSIS — E785 Hyperlipidemia, unspecified: Secondary | ICD-10-CM | POA: Diagnosis not present

## 2015-03-11 DIAGNOSIS — Z79899 Other long term (current) drug therapy: Secondary | ICD-10-CM | POA: Insufficient documentation

## 2015-03-11 HISTORY — DX: Vitamin D deficiency, unspecified: E55.9

## 2015-03-11 HISTORY — DX: Hyperlipidemia, unspecified: E78.5

## 2015-03-11 HISTORY — DX: Disorder of thyroid, unspecified: E07.9

## 2015-03-11 HISTORY — DX: Essential (primary) hypertension: I10

## 2015-03-11 LAB — COMPREHENSIVE METABOLIC PANEL
ALT: 18 U/L (ref 14–54)
ANION GAP: 9 (ref 5–15)
AST: 25 U/L (ref 15–41)
Albumin: 3.5 g/dL (ref 3.5–5.0)
Alkaline Phosphatase: 72 U/L (ref 38–126)
BILIRUBIN TOTAL: 0.5 mg/dL (ref 0.3–1.2)
BUN: 27 mg/dL — ABNORMAL HIGH (ref 6–20)
CO2: 28 mmol/L (ref 22–32)
Calcium: 9.8 mg/dL (ref 8.9–10.3)
Chloride: 107 mmol/L (ref 101–111)
Creatinine, Ser: 0.59 mg/dL (ref 0.44–1.00)
Glucose, Bld: 129 mg/dL — ABNORMAL HIGH (ref 65–99)
POTASSIUM: 4.3 mmol/L (ref 3.5–5.1)
Sodium: 144 mmol/L (ref 135–145)
TOTAL PROTEIN: 5.6 g/dL — AB (ref 6.5–8.1)

## 2015-03-11 LAB — CBC WITH DIFFERENTIAL/PLATELET
BASOS PCT: 0 %
Basophils Absolute: 0 10*3/uL (ref 0.0–0.1)
Eosinophils Absolute: 0 10*3/uL (ref 0.0–0.7)
Eosinophils Relative: 0 %
HEMATOCRIT: 41.5 % (ref 36.0–46.0)
Hemoglobin: 13.6 g/dL (ref 12.0–15.0)
Lymphocytes Relative: 26 %
Lymphs Abs: 2.8 10*3/uL (ref 0.7–4.0)
MCH: 31 pg (ref 26.0–34.0)
MCHC: 32.8 g/dL (ref 30.0–36.0)
MCV: 94.5 fL (ref 78.0–100.0)
MONO ABS: 0.6 10*3/uL (ref 0.1–1.0)
MONOS PCT: 6 %
Neutro Abs: 7.3 10*3/uL (ref 1.7–7.7)
Neutrophils Relative %: 68 %
Platelets: 172 10*3/uL (ref 150–400)
RBC: 4.39 MIL/uL (ref 3.87–5.11)
RDW: 14.1 % (ref 11.5–15.5)
WBC: 10.8 10*3/uL — ABNORMAL HIGH (ref 4.0–10.5)

## 2015-03-11 LAB — URINALYSIS, ROUTINE W REFLEX MICROSCOPIC
Bilirubin Urine: NEGATIVE
Glucose, UA: NEGATIVE mg/dL
HGB URINE DIPSTICK: NEGATIVE
Ketones, ur: NEGATIVE mg/dL
Nitrite: NEGATIVE
PROTEIN: NEGATIVE mg/dL
Specific Gravity, Urine: 1.023 (ref 1.005–1.030)
pH: 6 (ref 5.0–8.0)

## 2015-03-11 LAB — URINE MICROSCOPIC-ADD ON

## 2015-03-11 MED ORDER — ONDANSETRON 8 MG PO TBDP: 8.0000 mg | ORAL_TABLET | Freq: Three times a day (TID) | ORAL | Status: AC | PRN

## 2015-03-11 MED ORDER — SODIUM CHLORIDE 0.9 % IV SOLN: 1000.0000 mL | INTRAVENOUS | Status: DC

## 2015-03-11 MED ORDER — SODIUM CHLORIDE 0.9 % IV SOLN
1000.0000 mL | Freq: Once | INTRAVENOUS | Status: AC
  Administered 2015-03-11: 1000 mL via INTRAVENOUS

## 2015-03-11 MED ORDER — CEPHALEXIN 500 MG PO CAPS: 500.0000 mg | ORAL_CAPSULE | Freq: Three times a day (TID) | ORAL | Status: AC

## 2015-03-11 NOTE — ED Notes (Signed)
Per PTAR states dark, loose stool for a week-no N/V-no abdominal pain, positive bowel sounds-complaining of weakness

## 2015-03-11 NOTE — ED Provider Notes (Signed)
CSN: [id number]     Arrival date & time 03/11/15  1148 History   First MD Initiated Contact with Patient 03/11/15 1159     Chief Complaint  Patient presents with  . Diarrhea      HPI Diarrhea over the past several days.  No recent antibiotics or travel.  Reports no significant nausea or vomiting.  Denies dysuria or urinary frequency.  Reports mild abdominal cramping.  No fevers or chills.  Denies blood in her stool.  Reports generalized weakness and decreased oral intake    Past Medical History  Diagnosis Date  . Hypertension   . Thyroid disease   . Hyperlipidemia   . Vitamin D deficiency    Past Surgical History  Procedure Laterality Date  . Abdominal hysterectomy     No family history on file. Social History  Substance Use Topics  . Smoking status: Never Smoker   . Smokeless tobacco: None  . Alcohol Use: No   OB History    No data available     Review of Systems  All other systems reviewed and are negative.     Allergies  Norvasc  Home Medications   Prior to Admission medications   Medication Sig Start Date End Date Taking? Authorizing Provider  aspirin EC 81 MG EC tablet Take 1 tablet (81 mg total) by mouth daily. 07/30/13  Yes Orpah Cobb, MD  cholecalciferol (VITAMIN D) 1000 UNITS tablet Take 1,000 Units by mouth every morning.    Yes Historical Provider, MD  latanoprost (XALATAN) 0.005 % ophthalmic solution Place 1 drop into both eyes at bedtime.   Yes Historical Provider, MD  levothyroxine (SYNTHROID, LEVOTHROID) 50 MCG tablet Take 1 tablet (50 mcg total) by mouth daily before breakfast. 12/27/14  Yes Collene Gobble, MD  losartan (COZAAR) 100 MG tablet take 1 tablet by mouth once daily 01/13/15  Yes Collene Gobble, MD  metoprolol tartrate (LOPRESSOR) 25 MG tablet take 1 tablet by mouth twice a day 02/02/15  Yes Collene Gobble, MD  Multiple Vitamins-Minerals (ICAPS PO) Take 1 tablet by mouth 2 (two) times daily.    Yes Historical Provider, MD  pravastatin  (PRAVACHOL) 40 MG tablet take 1 tablet by mouth once daily 03/06/15  Yes Collene Gobble, MD  cephALEXin (KEFLEX) 500 MG capsule Take 1 capsule (500 mg total) by mouth 3 (three) times daily. 03/11/15   Azalia Bilis, MD  ondansetron (ZOFRAN ODT) 8 MG disintegrating tablet Take 1 tablet (8 mg total) by mouth every 8 (eight) hours as needed for nausea or vomiting. 03/11/15   Azalia Bilis, MD   BP 151/78 mmHg  Pulse 54  Temp(Src) 98.8 F (37.1 C) (Oral)  Resp 16  SpO2 95% Physical Exam  Constitutional: She is oriented to person, place, and time. She appears well-developed and well-nourished. No distress.  HENT:  Head: Normocephalic and atraumatic.  Dry mucous membranes  Eyes: EOM are normal.  Neck: Normal range of motion.  Cardiovascular: Normal rate, regular rhythm and normal heart sounds.   Pulmonary/Chest: Effort normal and breath sounds normal.  Abdominal: Soft. She exhibits no distension. There is no tenderness.  Musculoskeletal: Normal range of motion.  Neurological: She is alert and oriented to person, place, and time.  Skin: Skin is warm and dry.  Psychiatric: She has a normal mood and affect. Judgment normal.  Nursing note and vitals reviewed.   ED Course  Procedures (including critical care time) Labs Review Labs Reviewed  CBC WITH DIFFERENTIAL/PLATELET - Abnormal;  Notable for the following:    WBC 10.8 (*)    All other components within normal limits  COMPREHENSIVE METABOLIC PANEL - Abnormal; Notable for the following:    Glucose, Bld 129 (*)    BUN 27 (*)    Total Protein 5.6 (*)    All other components within normal limits  URINALYSIS, ROUTINE W REFLEX MICROSCOPIC (NOT AT [facility]) - Abnormal; Notable for the following:    APPearance CLOUDY (*)    Leukocytes, UA MODERATE (*)    All other components within normal limits  URINE MICROSCOPIC-ADD ON - Abnormal; Notable for the following:    Squamous Epithelial / LPF 0-5 (*)    Bacteria, UA MANY (*)    Crystals CA OXALATE  CRYSTALS (*)    All other components within normal limits    Imaging Review Dg Abd 2 Views  03/11/2015  CLINICAL DATA:  Dark loose stools for 1 week, weakness, history hypertension, hyperlipidemia EXAM: ABDOMEN - 2 VIEW COMPARISON:  Not FINDINGS: Nonobstructive bowel gas pattern. Scattered stool throughout colon. No bowel dilatation, bowel wall thickening or free air. Marked osseous demineralization with levoconvex lumbar scoliosis. Scattered atherosclerotic calcifications and pelvic phleboliths. No definite urine tract calcification. IMPRESSION: Nonspecific bowel gas pattern. Electronically Signed   By: Ulyses Southward M.D.   On: 03/11/2015 13:09   I have personally reviewed and evaluated these images and lab results as part of my medical decision-making.   EKG Interpretation None      MDM   Final diagnoses:  Diarrhea  Abdominal pain  Acute cystitis without hematuria  Dehydration    Patient feeling better after IV fluids.  Patient does have a urinary tract infection.  Urine culture sent.  Home on Keflex.  Repeat abdominal exam is benign.  Vital signs are normal.  Primary care follow-up.  She understands to return to the ER for new or worsening symptoms    Azalia Bilis, MD 03/11/15 [phone]

## 2015-03-11 NOTE — ED Notes (Signed)
I ATTEMPTED TO COLLECT LABS AND WAS UNSUCCESSFUL.  I MADE THE NURSE AWARE. 

## 2015-03-17 ENCOUNTER — Encounter: Payer: Self-pay | Admitting: Emergency Medicine

## 2015-03-17 ENCOUNTER — Ambulatory Visit (INDEPENDENT_AMBULATORY_CARE_PROVIDER_SITE_OTHER): Payer: Medicare Other | Admitting: Emergency Medicine

## 2015-03-17 VITALS — BP 100/64 | HR 67 | Temp 96.1°F | Resp 20 | Ht 67.0 in | Wt 95.4 lb

## 2015-03-17 DIAGNOSIS — R634 Abnormal weight loss: Secondary | ICD-10-CM

## 2015-03-17 DIAGNOSIS — I952 Hypotension due to drugs: Secondary | ICD-10-CM | POA: Diagnosis not present

## 2015-03-17 DIAGNOSIS — J90 Pleural effusion, not elsewhere classified: Secondary | ICD-10-CM

## 2015-03-17 NOTE — Progress Notes (Signed)
Patient ID: Bonnie [name], female   DOB: 1917-10-02, 82 y.o.   MRN: [id number]    By signing my name below, I, Bonnie [name], attest that this documentation has been prepared under the direction and in the presence of Bonnie Gobble, MD Electronically Signed: Charline Bills, ED Scribe 03/17/2015 at 11:36 AM.  Chief Complaint:  Chief Complaint  Patient presents with  . Follow-up  . Hospital vs    03/11/2015   HPI: Bonnie [name] is a 82 y.o. female, with a h/o HTN, hyperlipidemia, thyroid disease, who reports to [facility], brought in by her sister, today for a follow-up following hospitalization on 03/11/15 for acute cystitis. Pt has a caregiver from [facility] [employer] from 9am-5pm Monday-Friday. Recently, she has been able to drink some of her Boost nutritional drinks but states that they are a bit sweet. Pt states that she is still experiencing fatigue, appetite change, diarrhea and abdominal pain that is worsened with palpation. She denies SOB and confusion. Palliative care was discussed with both the pt and her sister today. Pt states that she does not care if she is at home or in the hospital and will leave it up to her sister to decide. Pt and her sister report that pt has signed her DNR, but the pt is unsure if she wants to have another pleurodesis if she were to have another pleural effusion.   Past Medical History  Diagnosis Date  . Hypertension   . Thyroid disease   . Hyperlipidemia   . Vitamin D deficiency    Past Surgical History  Procedure Laterality Date  . Abdominal hysterectomy     Social History   Social History  . Marital Status: Widowed    Spouse Name: N/A  . Number of Children: N/A  . Years of Education: N/A   Social History Main Topics  . Smoking status: Never Smoker   . Smokeless tobacco: None  . Alcohol Use: No  . Drug Use: None  . Sexual Activity: Not Asked   Other Topics Concern  . None   Social History Narrative   No family history on file. Allergies    Allergen Reactions  . Norvasc [Amlodipine Besylate]     Unknown reaction    Prior to Admission medications   Medication Sig Start Date End Date Taking? Authorizing Provider  aspirin EC 81 MG EC tablet Take 1 tablet (81 mg total) by mouth daily. 07/30/13   Orpah Cobb, MD  cephALEXin (KEFLEX) 500 MG capsule Take 1 capsule (500 mg total) by mouth 3 (three) times daily. 03/11/15   Azalia Bilis, MD  cholecalciferol (VITAMIN D) 1000 UNITS tablet Take 1,000 Units by mouth every morning.     Historical Provider, MD  latanoprost (XALATAN) 0.005 % ophthalmic solution Place 1 drop into both eyes at bedtime.    Historical Provider, MD  levothyroxine (SYNTHROID, LEVOTHROID) 50 MCG tablet Take 1 tablet (50 mcg total) by mouth daily before breakfast. 12/27/14   Bonnie Gobble, MD  losartan (COZAAR) 100 MG tablet take 1 tablet by mouth once daily 01/13/15   Bonnie Gobble, MD  metoprolol tartrate (LOPRESSOR) 25 MG tablet take 1 tablet by mouth twice a day 02/02/15   Bonnie Gobble, MD  Multiple Vitamins-Minerals (ICAPS PO) Take 1 tablet by mouth 2 (two) times daily.     Historical Provider, MD  ondansetron (ZOFRAN ODT) 8 MG disintegrating tablet Take 1 tablet (8 mg total) by mouth every 8 (eight) hours as needed  for nausea or vomiting. 03/11/15   Azalia Bilis, MD  pravastatin (PRAVACHOL) 40 MG tablet take 1 tablet by mouth once daily 03/06/15   Bonnie Gobble, MD   ROS: The patient denies fevers, chills, night sweats, unintentional weight loss, chest pain, palpitations, wheezing, -SOB, -confusion, nausea, vomiting, abdominal pain, dysuria, hematuria, melena, numbness, weakness, or tingling. +fatigue, + appetite change, +abdominal pain, +diarrhea   All other systems have been reviewed and were otherwise negative with the exception of those mentioned in the HPI and as above.    PHYSICAL EXAM: Filed Vitals:   03/17/15 1043  BP: 100/64  Pulse: 67  Temp: 96.1 F (35.6 C)  Resp: 20   Body mass index is 14.94  kg/(m^2).  General: Alert, no acute distress. Pt lying in fetal position.  HEENT:  Normocephalic, atraumatic, oropharynx patent. Eye: [facility], [facility]. [place]. Cardiovascular: Regular rate and rhythm, no rubs murmurs or gallops. No Carotid bruits, radial pulse intact. No pedal edema.  Respiratory: Decreased breath sounds in R base with rhonchi present superiorly.  Abdominal: Diffuse tenderness without rebound.  Musculoskeletal: Gait intact. No edema, tenderness Skin: No rashes. Hands are cool to touch.  Neurologic: Facial musculature symmetric. Psychiatric: Patient acts appropriately throughout our interaction. Lymphatic: No cervical or submandibular lymphadenopathy  LABS:  EKG/XRAY:   Primary read interpreted by Dr. Cleta Alberts at [facility].  ASSESSMENT/PLAN: We had a good discussion today. Patient talked about end-of-life issues. She does not want to go back to the hospital. She has artery signed her DO NOT RESUSCITATE. I did check a cortisol today. I called hospice today and made a referral for them to initiate care. Patient would prefer to die at home. She does not want to have any more procedures done. With her last thoracentesis she had a pneumothorax.I personally performed the services described in this documentation, which was scribed in my presence. The recorded information has been reviewed and is accurate.    Gross sideeffects, risk and benefits, and alternatives of medications d/w patient. Patient is aware that all medications have potential sideeffects and we are unable to predict every sideeffect or drug-drug interaction that may occur.  [name] MD 03/17/2015 10:47 AM

## 2015-03-18 LAB — CORTISOL-AM, BLOOD: Cortisol - AM: 17.4 ug/dL

## 2015-03-30 ENCOUNTER — Telehealth: Payer: Self-pay

## 2015-03-30 NOTE — Telephone Encounter (Signed)
Dr. Cleta Albertsaub  [facility] called about this Pt. They wanted to let you know she has been admitted into hospice since march 3rd and her pulse has been running in the low 48-50s over the last two weeks, and her bp has been running in the 140s to 150s. She does take Metoprolol 25mg . They wanted to know did you want to change her dose or would you like to do something else?  Archie Pattenonya [phone]

## 2015-03-31 NOTE — Telephone Encounter (Signed)
Called Hospice, advised message.

## 2015-03-31 NOTE — Telephone Encounter (Signed)
I would like to leave the dose the same  because her blood pressure is 140-150.

## 2015-04-04 ENCOUNTER — Telehealth: Payer: Self-pay | Admitting: Emergency Medicine

## 2015-04-04 NOTE — Telephone Encounter (Signed)
Hospice called saying family is concerned about increased confusion- walking around w/out clothes on, refusing rx, saying she has eaten when she hasn't... Family would like to req. Urinalysis to rule out an UTI...  [phone]

## 2015-04-04 NOTE — Telephone Encounter (Signed)
Please order a urine culture. Also a routine urinalysis with microscopic.

## 2015-04-05 NOTE — Telephone Encounter (Signed)
Advised nurse to order urine.

## 2015-05-04 ENCOUNTER — Telehealth: Payer: Self-pay | Admitting: *Deleted

## 2015-05-04 MED ORDER — LATANOPROST 0.005 % OP SOLN: 1.0000 [drp] | Freq: Every day | OPHTHALMIC | Status: AC

## 2015-05-04 NOTE — Telephone Encounter (Signed)
Ms Bonnie [name] pt sister came in and requested a refill for eye drops, Xalatan.  Okayed per Dr Cleta Albertsaub with 5 refills.  Rx sent to pharmacy.  Ms Bonnie [name] states that pt is now at 86lbs and not eating much.  She has around the care 24/7 and hospice comes out once a week.

## 2015-05-04 NOTE — Telephone Encounter (Signed)
Call the sister and get the address of [place] [address] [place] so I can make a home visit on Friday after work. Or possibly Saturday

## 2015-05-04 NOTE — Telephone Encounter (Signed)
[address]

## 2015-05-25 ENCOUNTER — Other Ambulatory Visit: Payer: Self-pay | Admitting: Emergency Medicine

## 2015-06-15 ENCOUNTER — Telehealth: Payer: Self-pay

## 2015-06-15 NOTE — Telephone Encounter (Signed)
Hospice called stating that Bonnie [name] still having congestion and would like to know what we would recommend  Best number [phone]

## 2015-06-15 NOTE — Telephone Encounter (Signed)
Pt advised.

## 2015-06-15 NOTE — Telephone Encounter (Signed)
I would suggest Mucinex twice a day

## 2015-07-11 ENCOUNTER — Telehealth: Payer: Self-pay

## 2015-07-11 NOTE — Telephone Encounter (Signed)
Spoke to Mrs. Yeattes.  She said it is fine for Dr. Cleta Albertsaub to come out any day he likes this week.  I advised Dr. Cleta Albertsaub.

## 2015-08-02 NOTE — Telephone Encounter (Signed)
error 

## 2015-08-24 IMAGING — CR DG CHEST 2V
2 series · 2 of 2 positions shown · non-contrast
Comparison: 07/16/2014

CLINICAL DATA: Weight loss.

EXAM:
CHEST  2 VIEW

[PA]
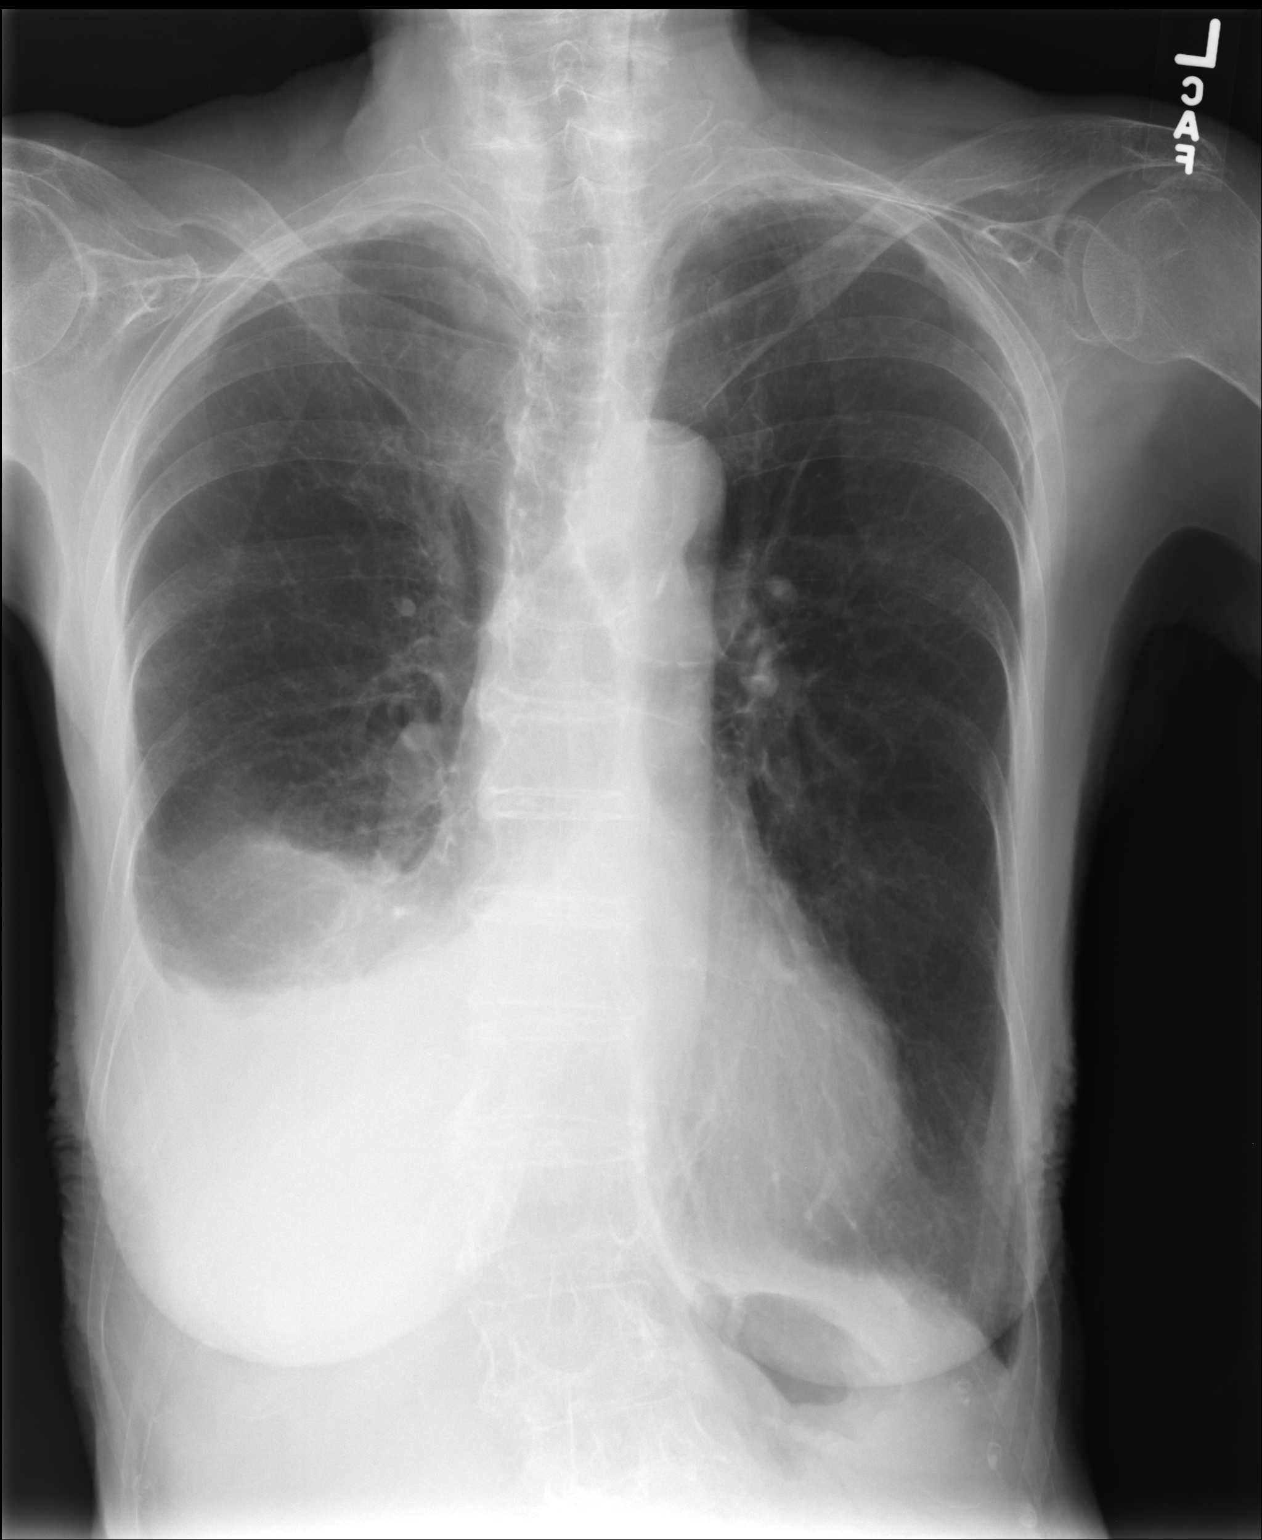

[lateral]
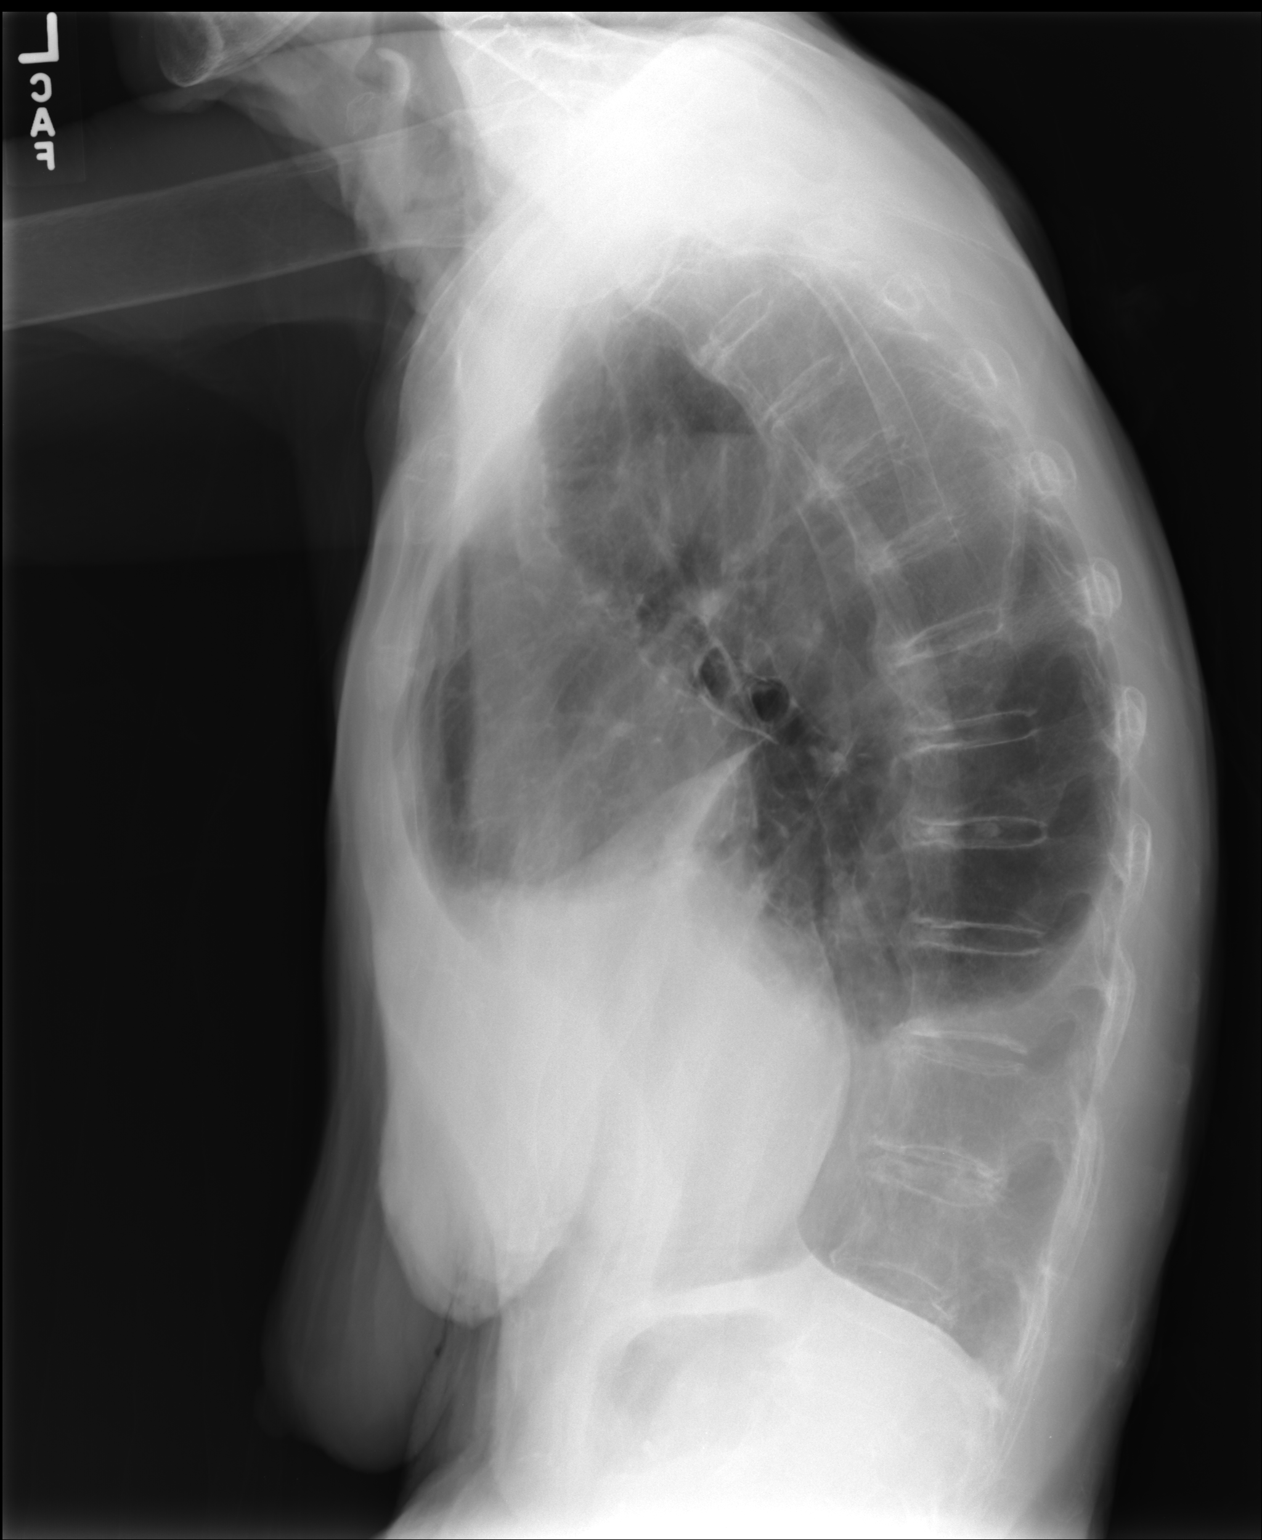

[2 of 2 positions shown; findings below may reference images not displayed]

FINDINGS: Mild cardiac enlargement. There is a moderate to large right pleural
effusion which appears increased in volume from previous exam. No
left pleural effusion. The lungs are hyperinflated and there are
coarsened interstitial changes compatible with COPD.
IMPRESSION: 1. Increase in volume of right pleural effusion.

## 2015-09-30 ENCOUNTER — Telehealth: Payer: Self-pay

## 2015-09-30 NOTE — Telephone Encounter (Signed)
Tanya RN reported on Bonnie [name] she has 3 plus edma bi-lateral feet, no pain or tenderness. She has left sided bronchi and her BP 100/50 HR 60. Her sister Bonnie [name] who is her primary caregiver, stopped giving her eye-cap vitamin just because she is having hard time swallowing them.  Please contact tanya for anymore questions regarding this call  [phone]

## 2015-10-01 NOTE — Telephone Encounter (Signed)
Call the nurse involved and tell her I will go by and see her next Friday

## 2015-10-04 ENCOUNTER — Telehealth: Payer: Self-pay

## 2015-10-04 NOTE — Telephone Encounter (Signed)
Patient  changed pharmacy to [facility]  [address] and needs new RX from Dr. Cleta Albertsaub for pravastatin (PRAVACHOL) 40

## 2015-10-04 NOTE — Telephone Encounter (Signed)
Spoke with Kenney Housemananya about Dr. Cleta Albertsaub coming to see pt.

## 2015-10-05 ENCOUNTER — Telehealth: Payer: Self-pay

## 2015-10-05 NOTE — Telephone Encounter (Signed)
Please send in prescription to the new pharmacy

## 2015-10-05 NOTE — Telephone Encounter (Signed)
Tonya from [facility] called on behalf of Bonnie CellaDorothy. Her BP is 89/60. She is currently taking Metoprolol 25 MG twice a day. Tonya request Dr. Cleta Albertsaub to call caregiver LibertyDoris. [phone]

## 2015-10-06 NOTE — Telephone Encounter (Signed)
Call doors and tell her to decrease the Metaprel all to a half tablet twice a day. I will come out to see her next week. It will probably be Wednesday afternoon.

## 2015-10-06 NOTE — Telephone Encounter (Signed)
Spoke with Tyler Aasoris. Instructed her to give Metoprolol to 1/2 tab a day and Dr. Cleta Albertsaub will be to see her on Wed afternoon. She verbalized understanding.

## 2015-10-10 ENCOUNTER — Other Ambulatory Visit: Payer: Self-pay

## 2015-10-10 MED ORDER — PRAVASTATIN SODIUM 40 MG PO TABS: 40.0000 mg | ORAL_TABLET | Freq: Every day | ORAL | 1 refills | Status: AC

## 2015-10-10 NOTE — Telephone Encounter (Signed)
Poke with pt regarding refill on pravachol.

## 2015-10-18 ENCOUNTER — Telehealth: Payer: Self-pay

## 2015-10-18 NOTE — Telephone Encounter (Signed)
Amy called in from [facility] to let [facility] know Marlene BastDorothy Beaubien has died 11/10/2015 at 5:40p

## 2015-10-19 ENCOUNTER — Telehealth: Payer: Self-pay | Admitting: Family Medicine

## 2015-10-19 NOTE — Telephone Encounter (Signed)
 I've let him know.  Deliah Boston, MS, PA-C 1:29 PM, 

## 2015-10-19 NOTE — Telephone Encounter (Signed)
We were told you were the contact for Dr Deforest Hoylesaubs patients and we are not sure if we will get death cert./paperwork on her. Britta MccreedyBarbara will also let Dr. Cleta Albertsaub know.

## 2015-10-19 NOTE — Telephone Encounter (Signed)
Death Certificate placed in Dr. Lonn GeorgiaSmiths box in MD Office. 3 pm AGB

## 2015-10-19 NOTE — Telephone Encounter (Signed)
B[facility] from [facility] called to let us know that one of daubs former paient has passed yesterday and that she has a death certificate to be signed I spoke with Dr Katrinka BlazingSmith she states that when she bring it by that she will sign off on the paper work for me to put it in her box and to let her know when I put it in in her box so she can sign off on paper work

## 2015-10-21 NOTE — Telephone Encounter (Signed)
 Death certificate signed on  at 4:00pm.  Certificate returned to Clerical TL.

## 2015-11-16 DEATH — deceased
